# Patient Record
Sex: Male | Born: 1965 | Race: White | Hispanic: No | Marital: Married | Smoking: Never smoker
Health system: Southern US, Community
[De-identification: ages and names within clinical notes are randomized; demographics above are authoritative.]

## PROBLEM LIST (undated history)

## (undated) DIAGNOSIS — G4730 Sleep apnea, unspecified: Secondary | ICD-10-CM

## (undated) DIAGNOSIS — K219 Gastro-esophageal reflux disease without esophagitis: Secondary | ICD-10-CM

## (undated) DIAGNOSIS — R1310 Dysphagia, unspecified: Secondary | ICD-10-CM

## (undated) DIAGNOSIS — I10 Essential (primary) hypertension: Secondary | ICD-10-CM

## (undated) HISTORY — PX: OTHER SURGICAL HISTORY: SHX169

## (undated) HISTORY — PX: APPENDECTOMY: SHX54

## (undated) HISTORY — PX: WRIST SURGERY: SHX841

---

## 1990-06-08 HISTORY — PX: NASAL SINUS SURGERY: SHX719

## 2007-06-09 DIAGNOSIS — R1310 Dysphagia, unspecified: Secondary | ICD-10-CM

## 2007-06-09 DIAGNOSIS — K219 Gastro-esophageal reflux disease without esophagitis: Secondary | ICD-10-CM

## 2007-06-09 HISTORY — DX: Dysphagia, unspecified: R13.10

## 2007-06-09 HISTORY — DX: Gastro-esophageal reflux disease without esophagitis: K21.9

## 2007-06-09 HISTORY — PX: CARDIAC CATHETERIZATION: SHX172

## 2008-01-11 ENCOUNTER — Observation Stay (HOSPITAL_COMMUNITY): Admission: EM | Admit: 2008-01-11 | Discharge: 2008-01-12 | Payer: Self-pay | Admitting: Emergency Medicine

## 2008-01-30 ENCOUNTER — Encounter: Admission: RE | Admit: 2008-01-30 | Discharge: 2008-01-30 | Payer: Self-pay | Admitting: Gastroenterology

## 2010-10-21 NOTE — H&P (Signed)
Larry Hutchinson, Larry Hutchinson             ACCOUNT NO.:  [id number]   MEDICAL RECORD NO.:  [id number]          PATIENT TYPE:  INP   LOCATION:  4715                         FACILITY:  [facility]   PHYSICIAN:  Jake Bathe, MD      DATE OF BIRTH:  April 04, 1966   DATE OF ADMISSION:  01/11/2008  DATE OF DISCHARGE:                              HISTORY & PHYSICAL   PRIMARY CARE PHYSICIAN:  Deirdre Peer. Nehemiah Settle, MD, [facility].   CHIEF COMPLAINT:  Chest pain.   HISTORY OF PRESENT ILLNESS:  A 45 year old male with obesity,  hypertension, obstructive sleep apnea with increasing substernal chest  pain radiating to his jaw, lower teeth, usually lasting 2-3 minutes in  duration and relieved with rest.  Was 5 to 7/10 in intensity, but has  had milder episodes over the past 2 weeks.  New onset since 2 weeks ago.  He saw Dr. Nehemiah Settle who prescribed him Protonix over the past 10 days and  he has not had any significant relief.  He also experiences some  diaphoresis, flushing, and shortness of breath in conjunction with his  chest discomfort.  Currently, he is resting comfortably in no acute  distress.  Wife at bedside.   PAST MEDICAL HISTORY:  Hypertension, obstructive sleep apnea on CPAP,  hyperobesity, recent tobacco use, quit in February 2008 after 25 years  of smoking, polycythemia presumably reactive secondary to history of  smoking and erectile dysfunction.   PAST SURGICAL HISTORY:  Appendectomy in 1974, right arm laceration  severing tendons and arteries in 1984, and left jaw fracture in 1994.   ALLERGIES:  No known drug allergies.   MEDICATIONS:  1. Avalide 300/25 mg a day.  2. Levitra p.r.n.  3. Aspirin 81 mg a day.  4. Protonix 20 mg a day.   SOCIAL HISTORY:  Used to smoke 2 packs a day for greater than 25 years,  but quit last February.  Rarely drinks alcohol.  He is married.  Sells  furniture.   FAMILY HISTORY:  There is no early family history of coronary artery  disease.  His  father did have hypertension.   REVIEW OF SYSTEMS:  Occasionally short of breath.  No nausea.  No  vomiting.  No diarrhea.  No fevers.  No orthopnea known.  Unless  specified above, all other 12 review of systems negative.   PHYSICAL EXAM:  VITAL SIGNS:  Blood pressure 120/68 on arrival was  157/97, heart rate 74 to 87, respirations 20, and sating 100% on 2 L.  GENERAL:  Alert and oriented x3 in no acute distress, pleasant, mildly  concerned, lying in bed.  EYES:  Well-perfused conjunctivae.  EOMI.  No scleral icterus.  NECK:  Thick.  No carotid bruits.  No JVD.  CARDIOVASCULAR:  Regular rate and rhythm with no murmurs, rubs, or  gallops.  Distant heart sounds secondary to body habitus.  Unable to  palpate PMI.  LUNGS:  Clear to auscultation bilaterally.  Normal respiratory effort.  ABDOMEN:  Obese.  Positive bowel sounds.  Did not palpate spleen.  Nontender.  EXTREMITIES:  No clubbing, cyanosis,  or edema, 2+ femoral pulses  bilaterally with no bruits.  SKIN:  Warm, dry, and intact.  No rashes.  NEUROLOGIC:  Nonfocal.  Did not assess gait.  No tremors noted.   DATA:  EKG performed down in the emergency department shows sinus rhythm  with poor R-wave progression, isolated small Q-wave in lead III.  Cannot  rule out old anterior infarction.  None for prior comparison.  He did  have a stress test approximately 5 years ago which was read as normal  with no ischemia.   LABS:  Sodium 138, potassium 4.1, BUN 19, creatinine 1.3, and glucose  107.  PT 12.9, INR 1.0, and PTT 28.  White count 11.6, hemoglobin 15,  hematocrit 45, and platelets 320,000.  Chest x-ray, no abnormalities,  personally reviewed.   ASSESSMENT AND PLAN:  A 45 year old male with chest pain concerning for  possible unstable angina, hypertension, obesity, obstructive sleep  apnea, reactive polycythemia, and erectile dysfunction.  1. Unstable angina - so far first set of cardiac biomarkers are      reassuring; however,  his story with his progressive jaw      pain/radiation of chest pain is quite concerning.  After discussing      possible options, I have decided to take into the cardiac      catheterization lab for further diagnosis and possible treatment.      He and his wife understand risks and benefits of the procedure      including stroke, heart attack, death, bleeding, renal impairment,      damage to artery or vein, and the possible need for transfusion.      We will continue beta-blocker, aspirin, anticoagulation as needed,      and will likely place on Plavix post catheterization.  I will also      check a fasting lipid profile in the morning.  2. Hypertension - continue with current drug regimen in addition to      beta-blocker.  3. Obesity - encourage weight loss.  4. Tobacco abuse - congratulate him on tobacco cessation over the past      year.  5. Obstructive sleep apnea - continue CPAP.      Jake Bathe, MD  Electronically Signed     MCS/MEDQ  D:  01/11/2008  T:  01/12/2008  Job:  [id number]   cc:   Deirdre Peer. Polite, M.D.

## 2010-10-21 NOTE — Discharge Summary (Signed)
NAMEMATTHER, LABELL             ACCOUNT NO.:  [id number]   MEDICAL RECORD NO.:  [id number]          PATIENT TYPE:  INP   LOCATION:  4715                         FACILITY:  [facility]   PHYSICIAN:  Jake Bathe, MD      DATE OF BIRTH:  [birth year]   DATE OF ADMISSION:  01/11/2008  DATE OF DISCHARGE:  01/12/2008                               DISCHARGE SUMMARY   PRIMARY CARE PHYSICIAN:  Deirdre Peer. Polite, MD   FINAL DIAGNOSES:  1. Chest pain.  2. Obesity.  3. Hypertension.  4. Obstructive sleep apnea - on continuous positive airway pressure.  5. Low HDL with hypertriglyceridemia - new, start Niaspan.  6. Polycythemia.  7. Erectile dysfunction.   PROCEDURES:  Cardiac catheterization - 30% proximal to mid right  coronary artery plaquing, 40% second diagonal plaquing.  The remainder  of arteries have no angiographically significant coronary artery  disease.  There was no wall motion abnormality.  Normal ejection  fraction.  There was no evidence of abdominal aortic aneurysm or renal  artery stenosis.   BRIEF HOSPITAL COURSE:  See H&P for full details, but Mr. Platten is a 45-  year-old male who over the past 2 weeks has been having progressive  worsening of chest pain described as substernal, radiating to his neck  and jaw, causing an aching sensation in his lower teeth.  This has  occasionally occurred with exertion, but has also occurred with rest and  is not necessarily associated with food.  He has noted occasional  dysphasia where when eating a large piece of stake he feels as food  getting stuck in his lower esophagus.  He denies any recent weight loss.  He quit smoking about a year ago after 25 years.   After seeing him in the ER, after this most recent bout of chest  discomfort, radiating to his jaw with diaphoresis and shortness of  breath, I decided to take him to the Cardiac Catheterization Lab for  further evaluation.  See above.  Overnight, he did well.  No longer  complaining of chest pain, ambulating well.  Leg is doing well.  No  problems distally.   PHYSICAL EXAMINATION:  VITAL SIGNS:  Blood pressure 120/76; pulse 80-77;  temperature afebrile, 97.9;  respirations 16; and satting 96% on room  air.  GENERAL:  Alert and oriented x3, in no acute distress.  CARDIOVASCULAR:  Regular rate and rhythm with no murmurs, rubs, or  gallops.  LUNGS:  Clear to auscultation bilaterally.  EXTREMITIES:  Cath site clean, dry, and intact.  No evidence of  hematoma.  No bruits.  Normal distal pulses.  ABDOMEN:  Obese.  Positive bowel sounds.   LABORATORY DATA:  TSH normal at 1.4.  Cardiac enzymes negative x3.  Sodium 140, potassium 3.8, BUN 15, and creatinine 1.2.  Total  cholesterol 147, triglycerides 359, HDL 22, and LDL 53.  White count  9.5, hemoglobin 14, hematocrit 40, and platelet count 291.  Urine drug  screen was negative.  BNP was normal, less than 30.  PT/PTT was normal.  Chest x-ray was unremarkable.  Telemetry overnight  was unremarkable,  sinus rhythm.   MEDICATIONS:  1. Plavix 75 mg once a day for 30 days.  2. Aspirin 81 mg once a day.  3. Niaspan 500 mg once a day (take aspirin 30 minutes prior).  4. Avalide 300/25 mg once a day.  5. Fish oil 1000 mg b.i.d.   Given his low HDL of 22 and his triglycerides greater than 350, decided  to start him on Niaspan.  In 2 weeks, hopeful titration up to 1000.  Warned him of flushing.  I also asked him to take fish oil 1 g b.i.d.   DISCHARGE INSTRUCTIONS:  No heavy lifting for 3 days.  No driving for 3  days.  May return to work on Monday.   FOLLOWUP:  Followup scheduled on January 26, 2008, at 1:00 p.m.  I also  had placed a GI referral to work up gastrointestinal cause for chest  pain with associated dysphasia.  Possible etiologies include esophageal  spasm, possible stricture, and rule out mass.  The patient is aware that  if his pain becomes more intense or more worrisome to notify me  immediately  or to return to the emergency department.      Jake Bathe, MD  Electronically Signed     MCS/MEDQ  D:  01/12/2008  T:  01/12/2008  Job:  [id number]   cc:   Deirdre Peer. Polite, M.D.

## 2011-03-06 LAB — CARDIAC PANEL(CRET KIN+CKTOT+MB+TROPI)
CK, MB: 2.4
Relative Index: 2
Relative Index: 2.1
Total CK: 116
Troponin I: 0.01
Troponin I: 0.01

## 2011-03-06 LAB — RAPID URINE DRUG SCREEN, HOSP PERFORMED
Amphetamines: NOT DETECTED
Cocaine: NOT DETECTED
Tetrahydrocannabinol: NOT DETECTED

## 2011-03-06 LAB — BASIC METABOLIC PANEL
BUN: 15
Chloride: 108
GFR calc Af Amer: >60
GFR calc non Af Amer: >60
Potassium: 3.8
Sodium: 140

## 2011-03-06 LAB — LIPID PANEL
Cholesterol: 147
HDL: 22 — ABNORMAL LOW
LDL Cholesterol: 53
Triglycerides: 359 — ABNORMAL HIGH

## 2011-03-06 LAB — DIFFERENTIAL
Lymphocytes Relative: 23
Lymphs Abs: 2.7
Monocytes Absolute: 1.1 — ABNORMAL HIGH
Monocytes Relative: 10
Neutro Abs: 7.5

## 2011-03-06 LAB — CBC
Hemoglobin: 15.8
Platelets: 291
RBC: 5.43
RDW: 12.9
WBC: 11.6 — ABNORMAL HIGH
WBC: 9.5

## 2011-03-06 LAB — URINALYSIS, ROUTINE W REFLEX MICROSCOPIC
Bilirubin Urine: NEGATIVE
Hgb urine dipstick: NEGATIVE
Ketones, ur: NEGATIVE
Nitrite: NEGATIVE
Urobilinogen, UA: 1
pH: 7

## 2011-03-06 LAB — POCT I-STAT, CHEM 8
Chloride: 102
HCT: 46
Hemoglobin: 15.6
Potassium: 4.1

## 2011-03-06 LAB — PROTIME-INR: INR: 1

## 2011-03-06 LAB — PHENYTOIN LEVEL, TOTAL: Phenytoin Lvl: <2.5 — ABNORMAL LOW

## 2011-03-06 LAB — POCT CARDIAC MARKERS
Myoglobin, poc: 67.7
Myoglobin, poc: 88.5
Troponin i, poc: <0.05

## 2011-03-06 LAB — TSH: TSH: 1.451

## 2011-03-06 LAB — APTT: aPTT: 29

## 2014-02-07 ENCOUNTER — Telehealth: Payer: Self-pay

## 2014-02-07 ENCOUNTER — Encounter (HOSPITAL_COMMUNITY)
Admission: EM | Disposition: A | Discharge status: Home / Self Care | Payer: Self-pay | Source: Home / Self Care | Attending: Emergency Medicine

## 2014-02-07 ENCOUNTER — Encounter (HOSPITAL_COMMUNITY): Payer: Self-pay | Admitting: Emergency Medicine

## 2014-02-07 ENCOUNTER — Emergency Department (HOSPITAL_COMMUNITY): Payer: BC Managed Care – PPO | Admitting: Anesthesiology

## 2014-02-07 ENCOUNTER — Encounter (HOSPITAL_COMMUNITY): Payer: BC Managed Care – PPO | Admitting: Anesthesiology

## 2014-02-07 ENCOUNTER — Emergency Department (HOSPITAL_COMMUNITY)
Admission: EM | Admit: 2014-02-07 | Discharge: 2014-02-07 | Disposition: A | Discharge status: Home / Self Care | Payer: BC Managed Care – PPO | Attending: Emergency Medicine | Admitting: Emergency Medicine

## 2014-02-07 DIAGNOSIS — Z87891 Personal history of nicotine dependence: Secondary | ICD-10-CM | POA: Diagnosis not present

## 2014-02-07 DIAGNOSIS — T18108A Unspecified foreign body in esophagus causing other injury, initial encounter: Secondary | ICD-10-CM

## 2014-02-07 DIAGNOSIS — W44F3XA Food entering into or through a natural orifice, initial encounter: Secondary | ICD-10-CM

## 2014-02-07 DIAGNOSIS — I10 Essential (primary) hypertension: Secondary | ICD-10-CM | POA: Diagnosis not present

## 2014-02-07 DIAGNOSIS — IMO0002 Reserved for concepts with insufficient information to code with codable children: Secondary | ICD-10-CM | POA: Insufficient documentation

## 2014-02-07 DIAGNOSIS — K219 Gastro-esophageal reflux disease without esophagitis: Secondary | ICD-10-CM | POA: Insufficient documentation

## 2014-02-07 DIAGNOSIS — T18128A Food in esophagus causing other injury, initial encounter: Secondary | ICD-10-CM

## 2014-02-07 DIAGNOSIS — K222 Esophageal obstruction: Secondary | ICD-10-CM

## 2014-02-07 HISTORY — PX: ESOPHAGOGASTRODUODENOSCOPY: SHX5428

## 2014-02-07 HISTORY — DX: Gastro-esophageal reflux disease without esophagitis: K21.9

## 2014-02-07 HISTORY — PX: FOREIGN BODY REMOVAL: SHX962

## 2014-02-07 HISTORY — DX: Dysphagia, unspecified: R13.10

## 2014-02-07 HISTORY — DX: Essential (primary) hypertension: I10

## 2014-02-07 HISTORY — DX: Sleep apnea, unspecified: G47.30

## 2014-02-07 SURGERY — EGD (ESOPHAGOGASTRODUODENOSCOPY)
Anesthesia: Monitor Anesthesia Care

## 2014-02-07 MED ORDER — GLUCAGON HCL RDNA (DIAGNOSTIC) 1 MG IJ SOLR
1.0000 mg | Freq: Once | INTRAMUSCULAR | Status: AC
  Administered 2014-02-07: 1 mg via INTRAVENOUS
  Filled 2014-02-07: qty 1

## 2014-02-07 MED ORDER — SODIUM CHLORIDE 0.9 % IV SOLN: INTRAVENOUS | Status: DC

## 2014-02-07 MED ORDER — MIDAZOLAM HCL 5 MG/5ML IJ SOLN
INTRAMUSCULAR | Status: DC | PRN
  Administered 2014-02-07: .5 mg via INTRAVENOUS

## 2014-02-07 MED ORDER — SUCCINYLCHOLINE CHLORIDE 20 MG/ML IJ SOLN
INTRAMUSCULAR | Status: DC | PRN
  Administered 2014-02-07: 140 mg via INTRAVENOUS

## 2014-02-07 MED ORDER — LACTATED RINGERS IV SOLN
INTRAVENOUS | Status: DC
  Administered 2014-02-07: 11:00:00 via INTRAVENOUS

## 2014-02-07 MED ORDER — LIDOCAINE HCL (CARDIAC) 20 MG/ML IV SOLN
INTRAVENOUS | Status: DC | PRN
  Administered 2014-02-07: 60 mg via INTRAVENOUS

## 2014-02-07 MED ORDER — LACTATED RINGERS IV SOLN
INTRAVENOUS | Status: DC | PRN
  Administered 2014-02-07: 12:00:00 via INTRAVENOUS

## 2014-02-07 MED ORDER — PROPOFOL 10 MG/ML IV BOLUS
INTRAVENOUS | Status: DC | PRN
  Administered 2014-02-07: 175 mg via INTRAVENOUS

## 2014-02-07 MED ORDER — FENTANYL CITRATE 0.05 MG/ML IJ SOLN
INTRAMUSCULAR | Status: DC | PRN
  Administered 2014-02-07: 50 ug via INTRAVENOUS

## 2014-02-07 NOTE — ED Notes (Signed)
Patient states he feels better than he did when he arrived but still feels like the food is still lodge.

## 2014-02-07 NOTE — Telephone Encounter (Signed)
Message copied by Fuller Keen on Wed Feb 07, 2014  2:56 PM ------      Message from: Erskine Emery D      Created: Wed Feb 07, 2014 12:55 PM       Please schedule this patient for an EGD with balloon dilation in the [facility] in approximately 2-3 weeks ------

## 2014-02-07 NOTE — Discharge Instructions (Signed)
Esophageal Stricture The esophagus is the long, narrow tube which carries food and liquid from the mouth to the stomach. Sometimes a part of the esophagus becomes narrow and makes it difficult, painful, or even impossible to swallow. This is called an esophageal stricture.  CAUSES  Common causes of blockage or strictures of the esophagus are:  Exposure of the lower esophagus to the acid from the stomach may cause narrowing.  Hiatal hernia in which a small part of the stomach bulges up through the diaphragm can cause a narrowing in the bottom of the esophagus.  Scleroderma is a tissue disorder that affects the esophagus and makes swallowing difficult.  Achalasia is an absence of nerves in the lower esophagus and to the esophageal sphincter. This absence of nerves may be congenital (present since birth). This can cause irregular spasms which do not allow food and fluid through.  Strictures may develop from swallowing materials which damage the esophagus. Examples are acids or alkalis such as lye.  Schatzki's Ring is a narrow ring of non-cancerous tissue which narrows the lower esophagus. The cause of this is unknown.  Growths can block the esophagus. SYMPTOMS  Some of the problems are difficulty swallowing or pain with swallowing. DIAGNOSIS  Your caregiver often suspects this problem by taking a medical history. They will also do a physical exam. They may then take X-rays and/or perform an endoscopy. Endoscopy is an exam in which a tube like a small flexible telescope is used to look at your esophagus.  TREATMENT  One form of treatment is to dilate the narrow area. This means to stretch it.  When this is not successful, chest surgery may be required. This is a much more extensive form of treatment with a longer recovery time. Both of the above treatments make the passage of food and water into the stomach easier. They also make it easier for stomach contents to bubble back into the  esophagus. Special medications may be used following the procedure to help prevent further narrowing. Medications may be used to lower the amount of acid in the stomach juice.  SEEK IMMEDIATE MEDICAL CARE IF:   Your swallowing is becoming more painful, difficult, or you are unable to swallow.  You vomit up blood.  You develop black tarry stools.  You develop chills.  You have a fever.  You develop chest or abdominal pain.  You develop shortness of breath, feel lightheaded, or faint. Follow up with medical care as your caregiver suggests. Document Released: 02/02/2006 Document Revised: 08/17/2011 Document Reviewed: 10/04/2013 [facility] Patient Information 2015 [place], [place]. This information is not intended to replace advice given to you by your health care provider. Make sure you discuss any questions you have with your health care provider. Esophagogastroduodenoscopy Care After Refer to this sheet in the next few weeks. These instructions provide you with information on caring for yourself after your procedure. Your caregiver may also give you more specific instructions. Your treatment has been planned according to current medical practices, but problems sometimes occur. Call your caregiver if you have any problems or questions after your procedure.  HOME CARE INSTRUCTIONS  Do not eat or drink anything until the numbing medicine (local anesthetic) has worn off and your gag reflex has returned. You will know that the local anesthetic has worn off when you can swallow comfortably.  Do not drive for 12 hours after the procedure or as directed by your caregiver.  Only take medicines as directed by your caregiver. [place]  CARE IF:   You cannot stop coughing.  You are not urinating at all or less than usual. SEEK IMMEDIATE MEDICAL CARE IF:  You have difficulty swallowing.  You cannot eat or drink.  You have worsening throat or chest pain.  You have dizziness,  lightheadedness, or you faint.  You have nausea or vomiting.  You have chills.  You have a fever.  You have severe abdominal pain.  You have black, tarry, or bloody stools. Document Released: 05/11/2012 Document Reviewed: 05/11/2012 [facility] Patient Information 2015 [place]. This information is not intended to replace advice given to you by your health care provider. Make sure you discuss any questions you have with your health care provider.

## 2014-02-07 NOTE — ED Notes (Signed)
Post administration of medication patient began to vomit. Food still lodged at this time.

## 2014-02-07 NOTE — Anesthesia Preprocedure Evaluation (Addendum)
Anesthesia Evaluation  Patient identified by MRN, date of birth, ID band Patient awake    Reviewed: Allergy & Precautions, H&P , NPO status , Patient's Chart, lab work & pertinent test results  History of Anesthesia Complications (+) AWARENESS UNDER ANESTHESIA  Airway       Dental   Pulmonary former smoker,          Cardiovascular hypertension, Pt. on medications     Neuro/Psych    GI/Hepatic GERD-  ,  Endo/Other    Renal/GU      Musculoskeletal   Abdominal   Peds  Hematology   Anesthesia Other Findings   Reproductive/Obstetrics                         Anesthesia Physical Anesthesia Plan  ASA: III and emergent  Anesthesia Plan: MAC and General   Post-op Pain Management:    Induction:   Airway Management Planned: Oral ETT  Additional Equipment:   Intra-op Plan:   Post-operative Plan:   Informed Consent: I have reviewed the patients History and Physical, chart, labs and discussed the procedure including the risks, benefits and alternatives for the proposed anesthesia with the patient or authorized representative who has indicated his/her understanding and acceptance.     Plan Discussed with:   Anesthesia Plan Comments:        Anesthesia Quick Evaluation

## 2014-02-07 NOTE — Anesthesia Postprocedure Evaluation (Signed)
  Anesthesia Post-op Note  Patient: Larry Hutchinson  Procedure(s) Performed: Procedure(s): ESOPHAGOGASTRODUODENOSCOPY (EGD) (N/A) FOREIGN BODY REMOVAL (N/A)  Patient Location: PACU  Anesthesia Type:General  Level of Consciousness: alert   Airway and Oxygen Therapy: Patient Spontanous Breathing  Post-op Pain: none  Post-op Assessment: Post-op Vital signs reviewed, Patient's Cardiovascular Status Stable and Respiratory Function Stable  Post-op Vital Signs: Reviewed and stable  Last Vitals:  Filed Vitals:   02/07/14 1254  BP: 180/121  Pulse: 94  Temp: 36.6 C  Resp: 13    Complications: No apparent anesthesia complications

## 2014-02-07 NOTE — Consult Note (Signed)
[facility] Consult: 9:11 AM 02/07/2014  LOS: 0 days    Referring Provider: Dr Colin Rhein in ED  Primary Care Physician:  Urgent Care Primary Gastroenterologist:  Dr. Penelope Coop.  Will not see pt cause > 5 yrs last seen.     Reason for Consultation:  Food impaction.    HPI: Larry Hutchinson is a 48 y.o. male.  Hx htn and GERD.    Esophagram in 2009 showed 1. Small hiatal hernia with Schatzki's ring that did allow passage  of a 12.5 mm barium tablet, though the tablet did pause at the EG  junction for approximately 1 minute prior to entering the stomach.  Of note, the patient did experience symptoms with the tablet in the  distal esophagus.  2. Gastroesophageal reflux not elicited with use of the water  siphon maneuver. No radiographic evidence of esophagitis.  3. Small left lateral pharyngocele. No significant abnormalities  in the pharynx.  Has acute dysphagia events about 3 to 4 times per year but he is able to get them to pass with swallowing liquids or vomting.  Takes Omeprazole 20 mg daily.  About 1730 last night, a piece of stew meat got stuck and he was not able to get it to move.  Came to ED at 2 AM.  Despite Glucagon, meat still stuck.   Past Medical History  Diagnosis Date  . Hypertension   . Dysphagia 2009  . GERD (gastroesophageal reflux disease) 2009    Past Surgical History  Procedure Laterality Date  . Appendectomy  age 69  . Wrist surgery Right     repair of trauma involving flexor tendons.   . Cardiac catheterization  2009    Prior to Admission medications   Medication Sig Start Date End Date Taking? Authorizing Provider  aspirin EC 81 MG tablet Take 81 mg by mouth daily.   Yes Historical Provider, MD  lisinopril-hydrochlorothiazide (PRINZIDE,ZESTORETIC) 20-25 MG per tablet Take 1  tablet by mouth daily.   Yes Historical Provider, MD  omega-3 acid ethyl esters (LOVAZA) 1 G capsule Take 1 g by mouth daily.   Yes Historical Provider, MD  omeprazole (PRILOSEC) 20 MG capsule Take 20 mg by mouth daily.   Yes Historical Provider, MD    Scheduled Meds:  Infusions:  PRN Meds:      Allergies as of 02/07/2014  . (No Known Allergies)    History reviewed. No pertinent family history.  History   Social History  . Marital Status: Married    Spouse Name: N/A    Number of Children: N/A  . Years of Education: N/A   Occupational History  . tool and dye fabricator    Social History Main Topics  . Smoking status: Former Research scientist (life sciences)  . Smokeless tobacco: Not on file  . Alcohol Use: Yes     Comment: rare glass of wine a few times per month.   . Drug Use: No  . Sexual Activity: Not on file   Other Topics Concern  . Not on file   Social History Narrative  .  No narrative on file    REVIEW OF SYSTEMS: Constitutional:  Stable weight.  No fatigue ENT:  No nose bleeds Pulm:  No cough or DOE CV:  No palpitations, no LE edema. No chest pain. GU:  No hematuria, no frequency GI:  Per HPI Heme:  No anemia   Transfusions:  none Neuro:  No headaches, no peripheral tingling or numbness Derm:  No itching, no rash or sores.  Endocrine:  No sweats or chills.  No polyuria or dysuria Immunization:  Not queried.  Travel:  None beyond local counties in last few months.    PHYSICAL EXAM: Vital signs in last 24 hours: Filed Vitals:   02/07/14 0740  BP: 168/102  Pulse: 88  Temp:   Resp: 22   Wt Readings from Last 3 Encounters:  02/07/14 117.028 kg (258 lb)  02/07/14 117.028 kg (258 lb)    General: pleasant, somewhat uncomfortable but well appearing WM Head:  No swelling or assymetry  Eyes:  No icterus or pallor Ears:  Not HOH  Nose:  No congestion or discharge Mouth:  Clear and moist Neck:  No mass, no tenderness Lungs:  Clear bil.  No labored breathing Heart:  RRR.  No MRG Abdomen:  Soft, BS hypoactive, no mass or HSM.   Rectal: deferred   Musc/Skeltl: no joint swelling or contracture Extremities:  No CCE  Neurologic:  Oriented x 3.  No limb weakness Skin:  No rash or sores Nodes:  No cervical adenopathy   Psych:  Pleasant, relaxed.  Appropriate.   Intake/Output from previous day:   Intake/Output this shift:    LAB RESULTS: No results found for this basename: WBC, HGB, HCT, PLT,  in the last 72 hours BMET Lab Results  Component Value Date   NA 140 01/12/2008   NA 138 01/11/2008   K 3.8 01/12/2008   K 4.1 01/11/2008   CL 108 01/12/2008   CL 102 01/11/2008   CO2 29 01/12/2008   GLUCOSE 117* 01/12/2008   GLUCOSE 107* 01/11/2008   BUN 15 01/12/2008   BUN 19 01/11/2008   CREATININE 1.20 01/12/2008   CREATININE 1.3 01/11/2008   CALCIUM 8.7 01/12/2008   LFT No results found for this basename: PROT, ALBUMIN, AST, ALT, ALKPHOS, BILITOT, BILIDIR, IBILI,  in the last 72 hours   RADIOLOGY STUDIES: No results found.  ENDOSCOPIC STUDIES: none  IMPRESSION:   *  Food impaction in pt with hx periodic dysphagia.  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     PLAN:     *  EGD with removal of FB today.  Pt agreeable.    Azucena Freed  02/07/2014, 9:11 AM Pager: [phone]  GI Attending Note   Chart was reviewed and patient was examined. X-rays and lab were reviewed.    I agree with management and plans.  Patient has a food impaction very likely due to  an esophageal stricture.  Sandy Salaam. Deatra Ina, M.D., [facility] Cell [phone]

## 2014-02-07 NOTE — ED Notes (Signed)
EDP made aware of patients condition is the same as when he arrived.

## 2014-02-07 NOTE — ED Notes (Signed)
MD at bedside. 

## 2014-02-07 NOTE — ED Notes (Signed)
Patient states he take omeprazole for his spasms of his throat. He feels like he has food stuck at the top of his stomach. He cant hold down any fluids. Airway patent at this time. No respiratory distress.

## 2014-02-07 NOTE — Transfer of Care (Signed)
Immediate Anesthesia Transfer of Care Note  Patient: Larry Hutchinson  Procedure(s) Performed: Procedure(s): ESOPHAGOGASTRODUODENOSCOPY (EGD) (N/A) FOREIGN BODY REMOVAL (N/A)  Patient Location: PACU and Endoscopy Unit  Anesthesia Type:General  Level of Consciousness: awake and alert   Airway & Oxygen Therapy: Patient Spontanous Breathing and Patient connected to nasal cannula oxygen  Post-op Assessment: Report given to PACU RN, Post -op Vital signs reviewed and stable and Patient moving all extremities  Post vital signs: Reviewed and stable  Complications: No apparent anesthesia complications

## 2014-02-07 NOTE — ED Provider Notes (Signed)
CSN: [id number]     Arrival date & time 02/07/14  0245 History   First MD Initiated Contact with Patient 02/07/14 0254     Chief Complaint  Patient presents with  . Gastrophageal Reflux  . Choking     (Consider location/radiation/quality/duration/timing/severity/associated sxs/prior Treatment) HPI Patient has history of esophageal rings and is followed by [facility] gastroenterology. He states he's had multiple episodes of temporary esophageal obstruction from food which resolved after 1-2 hours. Yesterday evening at 5:30 PM patient ate a small piece of beef. He states since that time he has been able to unable to swallow any liquids or solids without immediate vomiting. He's been unable to tolerate his secretions. Patient denies any airway obstruction or difficulty breathing. Past Medical History  Diagnosis Date  . Hypertension    Past Surgical History  Procedure Laterality Date  . Appendectomy    . Fracture surgery    . Cardiac catheterization     History reviewed. No pertinent family history. History  Substance Use Topics  . Smoking status: Former Research scientist (life sciences)  . Smokeless tobacco: Not on file  . Alcohol Use: Yes    Review of Systems  Constitutional: Negative for fever and chills.  HENT: Positive for trouble swallowing. Negative for sore throat.   Respiratory: Negative for chest tightness, shortness of breath, wheezing and stridor.   Gastrointestinal: Positive for vomiting. Negative for nausea, abdominal pain and diarrhea.  Musculoskeletal: Negative for neck pain.  Skin: Negative for rash and wound.  Neurological: Negative for dizziness, weakness, light-headedness and numbness.  All other systems reviewed and are negative.     Allergies  Review of patient's allergies indicates no known allergies.  Home Medications   Prior to Admission medications   Medication Sig Start Date End Date Taking? Authorizing Provider  aspirin EC 81 MG tablet Take 81 mg by mouth daily.   Yes  Historical Provider, MD  lisinopril-hydrochlorothiazide (PRINZIDE,ZESTORETIC) 20-25 MG per tablet Take 1 tablet by mouth daily.   Yes Historical Provider, MD  omega-3 acid ethyl esters (LOVAZA) 1 G capsule Take 1 g by mouth daily.   Yes Historical Provider, MD  omeprazole (PRILOSEC) 20 MG capsule Take 20 mg by mouth daily.   Yes Historical Provider, MD   BP 152/107  Pulse 88  Temp(Src) 98.3 F (36.8 C) (Oral)  Resp 16  Ht 5\' 8"  (1.727 m)  Wt 258 lb (117.028 kg)  BMI 39.24 kg/m2  SpO2 98% Physical Exam  Nursing note and vitals reviewed. Constitutional: He is oriented to person, place, and time. He appears well-developed and well-nourished. No distress.  Spitting up clear saliva  HENT:  Head: Normocephalic and atraumatic.  Mouth/Throat: Oropharynx is clear and moist. No oropharyngeal exudate.  Eyes: EOM are normal. Pupils are equal, round, and reactive to light.  Neck: Normal range of motion. Neck supple.  Cardiovascular: Normal rate and regular rhythm.  Exam reveals no gallop and no friction rub.   No murmur heard. Pulmonary/Chest: Effort normal and breath sounds normal. No stridor. No respiratory distress. He has no wheezes. He has no rales.  Abdominal: Soft. Bowel sounds are normal. He exhibits no distension and no mass. There is no tenderness. There is no rebound and no guarding.  Musculoskeletal: Normal range of motion. He exhibits no edema and no tenderness.  Neurological: He is alert and oriented to person, place, and time.  Skin: Skin is warm and dry. No rash noted. No erythema.  Psychiatric: He has a normal mood and affect. His behavior  is normal.    ED Course  Procedures (including critical care time) Labs Review Labs Reviewed - No data to display  Imaging Review No results found.   EKG Interpretation None      MDM   Final diagnoses:  None    We'll attempt glucagon/soda and reevaluate. Patient still is unable to tolerate PO's will discuss with  gastroenterology on call.   Giving glucagon with persistence of symptoms. We'll discuss with gastroenterology.  Discussed with Dr. Henrene Pastor. We'll see patient in the emergency department  Julianne Rice, MD 02/07/14 [phone]

## 2014-02-07 NOTE — Anesthesia Procedure Notes (Signed)
Procedure Name: Intubation Date/Time: 02/07/2014 12:44 PM Performed by: [name] Pre-anesthesia Checklist: Patient identified Patient Re-evaluated:Patient Re-evaluated prior to inductionOxygen Delivery Method: Circle system utilized Preoxygenation: Pre-oxygenation with 100% oxygen Intubation Type: IV induction Ventilation: Mask ventilation without difficulty Laryngoscope Size: Miller and 3 Grade View: Grade I Tube type: Oral Tube size: 7.5 mm Number of attempts: 1 Airway Equipment and Method: Stylet

## 2014-02-07 NOTE — Telephone Encounter (Signed)
Message copied by Keimon Keen on Wed Feb 07, 2014  1:53 PM ------      Message from: Erskine Emery D      Created: Wed Feb 07, 2014 12:55 PM       Please schedule this patient for an EGD with balloon dilation in the [facility] in approximately 2-3 weeks ------

## 2014-02-07 NOTE — Telephone Encounter (Signed)
Left message for the patient to call back. Appointments are scheduled.

## 2014-02-07 NOTE — Op Note (Signed)
[facility] [place], [address]   ENDOSCOPY PROCEDURE REPORT  PATIENT: Larry Hutchinson, Larry Hutchinson  MR#: [id number] BIRTHDATE: May 11, 1966 , 48  yrs. old GENDER: Male ENDOSCOPIST: Inda Castle, MD REFERRED BY: PROCEDURE DATE:  02/07/2014 PROCEDURE:  EGD w/ fb removal ASA CLASS:     Class II INDICATIONS:  Foreign body removal from esophagus. MEDICATIONS: See Anesthesia Report. TOPICAL ANESTHETIC:  DESCRIPTION OF PROCEDURE: After the risks benefits and alternatives of the procedure were thoroughly explained, informed consent was obtained.  The Pentax Gastroscope [id number] endoscope was introduced through the mouth and advanced to the third portion of the duodenum. Without limitations.  The instrument was slowly withdrawn as the mucosa was fully examined.      There was a moderate amount of solid food in the distal esophagus. This was gently pushed through a distal esophageal sphincter and into the stomach.  At the GE junction there was a moderate stricture.  The 9 mm gastroscope passed with mild resistance. There was moderate surrounding erythema in the area of the stricture.   The remainder of the upper endoscopy exam was otherwise normal.  Retroflexed views revealed no abnormalities. The scope was then withdrawn from the patient and the procedure completed.  COMPLICATIONS: There were no complications. ENDOSCOPIC IMPRESSION: 1.   esophageal food impaction 2. distal esophageal stricture  RECOMMENDATIONS: 1.  continue PPI therapy 2.  EGD with esophageal dilation in 2-3 weeks REPEAT EXAM:  eSigned:  Inda Castle, MD 02/07/2014 12:54 PM   CC:

## 2014-02-07 NOTE — ED Notes (Signed)
Azucena Freed, PA was at the bedside speaking with the pt.

## 2014-02-08 ENCOUNTER — Encounter (HOSPITAL_COMMUNITY): Payer: Self-pay | Admitting: Gastroenterology

## 2014-02-08 NOTE — Telephone Encounter (Signed)
I have left message for the patient to call back 

## 2014-02-09 ENCOUNTER — Other Ambulatory Visit: Payer: Self-pay

## 2014-02-09 DIAGNOSIS — K222 Esophageal obstruction: Secondary | ICD-10-CM

## 2014-02-09 NOTE — Telephone Encounter (Signed)
Patient advised and agrees to the appointments.

## 2014-02-19 ENCOUNTER — Ambulatory Visit (AMBULATORY_SURGERY_CENTER): Payer: Self-pay

## 2014-02-19 VITALS — Ht 68.0 in | Wt 258.0 lb

## 2014-02-19 DIAGNOSIS — R1319 Other dysphagia: Secondary | ICD-10-CM

## 2014-02-19 NOTE — Progress Notes (Signed)
No allergies to eggs or soy No past problems with anesthesia No home oxygen (CPAP) No diet/weight loss meds  Has email  Emmi instructions given for egd

## 2014-02-22 ENCOUNTER — Ambulatory Visit (AMBULATORY_SURGERY_CENTER): Payer: BC Managed Care – PPO | Admitting: Gastroenterology

## 2014-02-22 ENCOUNTER — Encounter: Payer: Self-pay | Admitting: Gastroenterology

## 2014-02-22 VITALS — BP 128/79 | HR 72 | Temp 96.5°F | Resp 23 | Ht 68.0 in | Wt 258.0 lb

## 2014-02-22 DIAGNOSIS — K222 Esophageal obstruction: Secondary | ICD-10-CM

## 2014-02-22 DIAGNOSIS — R1319 Other dysphagia: Secondary | ICD-10-CM

## 2014-02-22 DIAGNOSIS — K2980 Duodenitis without bleeding: Secondary | ICD-10-CM

## 2014-02-22 MED ORDER — SODIUM CHLORIDE 0.9 % IV SOLN: 500.0000 mL | INTRAVENOUS | Status: DC

## 2014-02-22 NOTE — Op Note (Signed)
[facility]  [address] [place], [address]   ENDOSCOPY PROCEDURE REPORT  PATIENT: Larry Hutchinson, Larry Hutchinson  MR#: [id number] BIRTHDATE: 02/26/1966 , 48  yrs. old GENDER: Male ENDOSCOPIST: Inda Castle, MD REFERRED BY: PROCEDURE DATE:  02/22/2014 PROCEDURE:  EGD w/ biopsy and Maloney dilation of esophagus ASA CLASS:     Class II INDICATIONS:  Therapeutic procedure.   Dysphagia. MEDICATIONS: MAC sedation, administered by CRNA and propofol (Diprivan) 200mg  IV TOPICAL ANESTHETIC:  DESCRIPTION OF PROCEDURE: After the risks benefits and alternatives of the procedure were thoroughly explained, informed consent was obtained.  The LB [id number] endoscope was introduced through the mouth and advanced to the third portion of the duodenum. Without limitations.  The instrument was slowly withdrawn as the mucosa was fully examined.        ESOPHAGUS: A stricture was found at the gastroesophageal junction. The stenosis was traversable with the endoscope.  DUODENUM: A single polyp was found at the apex of the duodenal bulb. polyp was semi-pedunculated with normal overlying mucosa. the remainder the exam is normal. Multiple biopsies were taken. Retroflexed views revealed no abnormalities.     The scope was then withdrawn from the patient and the procedure completed.  #s 48 and 52 [place] Maloney dilators were passed with moderate resistance. There was no heme.  COMPLICATIONS: There were no complications. ENDOSCOPIC IMPRESSION: esophageal stricture-status post Venia Minks dilation Duodenal polyp  RECOMMENDATIONS: Repeat dilation as needed Await biopsy results Continue omeprazole REPEAT EXAM:  eSigned:  Inda Castle, MD 02/22/2014 8:56 AM   GG:YIRSWN, Jori Moll MD  PATIENT NAME:  [name], [name] MR#: [id number]

## 2014-02-22 NOTE — Patient Instructions (Addendum)
YOU HAD AN ENDOSCOPIC PROCEDURE TODAY AT [facility]: Refer to the procedure report that was given to you for any specific questions about what was found during the examination.  If the procedure report does not answer your questions, please call your gastroenterologist to clarify.  If you requested that your care partner not be given the details of your procedure findings, then the procedure report has been included in a sealed envelope for you to review at your convenience later.  YOU SHOULD EXPECT: Some feelings of bloating in the abdomen. Passage of more gas than usual.  Walking can help get rid of the air that was put into your GI tract during the procedure and reduce the bloating. If you had a lower endoscopy (such as a colonoscopy or flexible sigmoidoscopy) you may notice spotting of blood in your stool or on the toilet paper. If you underwent a bowel prep for your procedure, then you may not have a normal bowel movement for a few days.  DIET: Drink plenty of fluids but you should avoid alcoholic beverages for 24 hours.  Please follow the dilatation diet the rest of the day.  ACTIVITY: Your care partner should take you home directly after the procedure.  You should plan to take it easy, moving slowly for the rest of the day.  You can resume normal activity the day after the procedure however you should NOT DRIVE or use heavy machinery for 24 hours (because of the sedation medicines used during the test).    SYMPTOMS TO REPORT IMMEDIATELY: A gastroenterologist can be reached at any hour.  During normal business hours, 8:30 AM to 5:00 PM Monday through Friday, call [phone].  After hours and on weekends, please call the GI answering service at [phone] who will take a message and have the physician on call contact you.    Following upper endoscopy (EGD)  Vomiting of blood or coffee ground material  New chest pain or pain under the shoulder blades  Painful or  persistently difficult swallowing  New shortness of breath  Fever of 100F or higher  Black, tarry-looking stools  FOLLOW UP: If any biopsies were taken you will be contacted by phone or by letter within the next 1-3 weeks.  Call your gastroenterologist if you have not heard about the biopsies in 3 weeks.  Our staff will call the home number listed on your records the next business day following your procedure to check on you and address any questions or concerns that you may have at that time regarding the information given to you following your procedure. This is a courtesy call and so if there is no answer at the home number and we have not heard from you through the emergency physician on call, we will assume that you have returned to your regular daily activities without incident.     Handout was given to your care partner on a dilatation diet to follow the rest of the day. You may resume your current medications today.  Continue omeprazole per Dr. Deatra Ina. Await biopsy results. Please call if any questions or concerns.    SIGNATURES/CONFIDENTIALITY: You and/or your care partner have signed paperwork which will be entered into your electronic medical record.  These signatures attest to the fact that that the information above on your After Visit Summary has been reviewed and is understood.  Full responsibility of the confidentiality of this discharge information lies with you and/or your care-partner.

## 2014-02-22 NOTE — Progress Notes (Signed)
Called to room to assist during endoscopic procedure.  Patient ID and intended procedure confirmed with present staff. Received instructions for my participation in the procedure from the performing physician.  

## 2014-02-22 NOTE — Progress Notes (Signed)
No problems noted in the recovery room. maw 

## 2014-02-22 NOTE — Progress Notes (Signed)
Report to PACU, RN, vss, BBS= Clear.  

## 2014-02-23 ENCOUNTER — Telehealth: Payer: Self-pay | Admitting: *Deleted

## 2014-02-23 NOTE — Telephone Encounter (Signed)
  Follow up Call-  Call back number 02/22/2014  Post procedure Call Back phone  # [phone]  Permission to leave phone message Yes     Patient questions:  Do you have a fever, pain , or abdominal swelling? No. Pain Score  0 *  Have you tolerated food without any problems? Yes.    Have you been able to return to your normal activities? Yes.    Do you have any questions about your discharge instructions: Diet   No. Medications  No. Follow up visit  No.  Do you have questions or concerns about your Care? No.  Actions: * If pain score is 4 or above: No action needed, pain <4.

## 2014-02-28 ENCOUNTER — Encounter: Payer: Self-pay | Admitting: Gastroenterology

## 2014-03-23 ENCOUNTER — Telehealth: Payer: Self-pay | Admitting: Internal Medicine

## 2014-03-23 NOTE — Telephone Encounter (Signed)
Pt is not seeing [facility] and would like to est w/ [facility]. Would like to know if you will accept him as a pt?

## 2014-03-26 NOTE — Telephone Encounter (Signed)
My practice is closed.

## 2014-04-10 ENCOUNTER — Other Ambulatory Visit (INDEPENDENT_AMBULATORY_CARE_PROVIDER_SITE_OTHER): Payer: BC Managed Care – PPO

## 2014-04-10 ENCOUNTER — Ambulatory Visit (INDEPENDENT_AMBULATORY_CARE_PROVIDER_SITE_OTHER): Payer: BC Managed Care – PPO | Admitting: Family

## 2014-04-10 ENCOUNTER — Encounter: Payer: Self-pay | Admitting: Family

## 2014-04-10 VITALS — BP 146/90 | HR 62 | Temp 97.8°F | Resp 16 | Ht 68.0 in | Wt 263.0 lb

## 2014-04-10 DIAGNOSIS — R200 Anesthesia of skin: Secondary | ICD-10-CM

## 2014-04-10 DIAGNOSIS — R208 Other disturbances of skin sensation: Secondary | ICD-10-CM | POA: Diagnosis not present

## 2014-04-10 DIAGNOSIS — M25579 Pain in unspecified ankle and joints of unspecified foot: Secondary | ICD-10-CM | POA: Diagnosis not present

## 2014-04-10 DIAGNOSIS — G4730 Sleep apnea, unspecified: Secondary | ICD-10-CM

## 2014-04-10 LAB — HEMOGLOBIN A1C: HEMOGLOBIN A1C: 5.5 % (ref 4.6–6.5)

## 2014-04-10 MED ORDER — LISINOPRIL-HYDROCHLOROTHIAZIDE 20-25 MG PO TABS: 1.0000 | ORAL_TABLET | Freq: Every day | ORAL | Status: DC

## 2014-04-10 NOTE — Patient Instructions (Addendum)
Thank you for choosing [facility].  Summary/Instructions:   Try over the counter medication such as ibuprofen and aleve as needed for pain  Please stop by the lab before leaving for your blood work. We will be in touch regarding the results.  Try [employer] for shoes if you need them  Plan to follow up in about a month for your blood pressure.

## 2014-04-10 NOTE — Progress Notes (Signed)
   Subjective:    Patient ID: Larry Hutchinson, male    DOB: December 13, 1965, 48 y.o.   MRN: [id number]  Chief Complaint  Patient presents with  . Establish Care    Having bilateral foot pain x1 year   HPI:  Larry Hutchinson is a 48 y.o. male who presents today to establish care and discuss foot pain.   1) Foot pain - Not constant pain. Both feet feel like they are bruised throughout the entire foot. Usually worst when sitting for long periods of time. Sitting does bother it on occasion. Describes it as cyclical. Goes through periods of time where it gets worse. At worst, the pain is 8/10, currently rated around a 5-6/10. Denies current numbness and burning, but not experiencing it. Denies any trauma to either foot. No footwear make it better or worse. Has tried Aleve, but does not provide any relief that he can recall. After he has been up and walking the pain disappears.   No Known Allergies  Current Outpatient Prescriptions on File Prior to Visit  Medication Sig Dispense Refill  . aspirin EC 81 MG tablet Take 81 mg by mouth daily.    [name] omega-3 acid ethyl esters (LOVAZA) 1 G capsule Take 1 g by mouth daily.    [name] omeprazole (PRILOSEC) 20 MG capsule Take 20 mg by mouth daily.     No current facility-administered medications on file prior to visit.   Past Medical History  Diagnosis Date  . Hypertension   . Dysphagia 2009  . GERD (gastroesophageal reflux disease) 2009  . Sleep apnea     uses CPAP at night    Review of Systems   See HPI   Objective:    BP 146/90 mmHg  Pulse 62  Temp(Src) 97.8 F (36.6 C) (Oral)  Resp 16  Ht 5\' 8"  (1.727 m)  Wt 263 lb (119.296 kg)  BMI 40.00 kg/m2  SpO2 95% Nursing note and vital signs reviewed.  Physical Exam  Constitutional: He is oriented to person, place, and time. No distress.  Obese male seated in the chair, appears his stated age, and is dressed appropriately.  Cardiovascular: Normal rate, regular rhythm, normal heart sounds and  intact distal pulses.   Pulmonary/Chest: Effort normal and breath sounds normal.  Musculoskeletal:  No obvious deformity, discoloration or edema of bilateral feet noted. Unable to elicit tenderness bilaterally. Sensation and strength intact and appropriate. Pulses are 1+ posterior tibial and the dorsalis pedis.   Neurological: He is alert and oriented to person, place, and time.  Skin: Skin is warm and dry.  Psychiatric: He has a normal mood and affect. His behavior is normal. Judgment and thought content normal.       Assessment & Plan:

## 2014-04-10 NOTE — Assessment & Plan Note (Signed)
Symptoms consistent with plantar facsiciities, however exam does not confirm. Given occasional numbness and tingling will check B12 and HgA1c. Use OTC NSAIDs as needed for pain control. Ice massage as needed. Follow up if symptoms worsen or fail to improve.

## 2014-04-10 NOTE — Progress Notes (Signed)
Pre visit review using our clinic review tool, if applicable. No additional management support is needed unless otherwise documented below in the visit note. 

## 2014-04-12 ENCOUNTER — Telehealth: Payer: Self-pay | Admitting: Family

## 2014-04-12 LAB — METHYLMALONIC ACID, SERUM: Methylmalonic Acid, Quant: 139 nmol/L (ref 87–318)

## 2014-04-12 NOTE — Telephone Encounter (Signed)
Please call the patient and inform him that both his A1c and B12 were within the normal limits thus he does not have diabetes or a vitamin B12 deficiency.  Therefore these are not the reason for the numbness of his feet.

## 2014-04-13 NOTE — Telephone Encounter (Signed)
Let pt know all his lab result were within normal limits.

## 2014-04-16 ENCOUNTER — Other Ambulatory Visit: Payer: Self-pay

## 2014-04-16 DIAGNOSIS — G4730 Sleep apnea, unspecified: Secondary | ICD-10-CM

## 2014-05-18 ENCOUNTER — Encounter: Payer: Self-pay | Admitting: Family

## 2014-05-18 ENCOUNTER — Ambulatory Visit (INDEPENDENT_AMBULATORY_CARE_PROVIDER_SITE_OTHER): Payer: BC Managed Care – PPO | Admitting: Family

## 2014-05-18 ENCOUNTER — Other Ambulatory Visit (INDEPENDENT_AMBULATORY_CARE_PROVIDER_SITE_OTHER): Payer: BC Managed Care – PPO

## 2014-05-18 VITALS — BP 132/86 | HR 71 | Temp 97.9°F | Ht 68.0 in | Wt 266.0 lb

## 2014-05-18 DIAGNOSIS — Z0001 Encounter for general adult medical examination with abnormal findings: Secondary | ICD-10-CM | POA: Insufficient documentation

## 2014-05-18 DIAGNOSIS — R790 Abnormal level of blood mineral: Secondary | ICD-10-CM

## 2014-05-18 DIAGNOSIS — Z0000 Encounter for general adult medical examination without abnormal findings: Secondary | ICD-10-CM

## 2014-05-18 DIAGNOSIS — N529 Male erectile dysfunction, unspecified: Secondary | ICD-10-CM | POA: Insufficient documentation

## 2014-05-18 LAB — BASIC METABOLIC PANEL
BUN: 20 mg/dL (ref 6–23)
CO2: 27 mEq/L (ref 19–32)
CREATININE: 1.3 mg/dL (ref 0.4–1.5)
Calcium: 9.3 mg/dL (ref 8.4–10.5)
Chloride: 108 mEq/L (ref 96–112)
GFR: 62.18 mL/min (ref >60.00)
Glucose, Bld: 90 mg/dL (ref 70–99)
Potassium: 4 mEq/L (ref 3.5–5.1)
Sodium: 139 mEq/L (ref 135–145)

## 2014-05-18 LAB — LIPID PANEL
CHOL/HDL RATIO: 6
CHOLESTEROL: 168 mg/dL (ref 0–200)
HDL: 30 mg/dL — AB (ref >39.00)
NONHDL: 138
TRIGLYCERIDES: 201 mg/dL — AB (ref 0.0–149.0)
VLDL: 40.2 mg/dL — AB (ref 0.0–40.0)

## 2014-05-18 LAB — CBC
HCT: 46.3 % (ref 39.0–52.0)
HEMOGLOBIN: 15.4 g/dL (ref 13.0–17.0)
MCHC: 33.2 g/dL (ref 30.0–36.0)
MCV: 83.9 fl (ref 78.0–100.0)
Platelets: 347 10*3/uL (ref 150.0–400.0)
RBC: 5.51 Mil/uL (ref 4.22–5.81)
RDW: 13.4 % (ref 11.5–15.5)
WBC: 12.6 10*3/uL — ABNORMAL HIGH (ref 4.0–10.5)

## 2014-05-18 LAB — TSH: TSH: 1.45 u[IU]/mL (ref 0.35–4.50)

## 2014-05-18 LAB — LDL CHOLESTEROL, DIRECT: Direct LDL: 99.5 mg/dL

## 2014-05-18 MED ORDER — SILDENAFIL CITRATE 50 MG PO TABS: 50.0000 mg | ORAL_TABLET | Freq: Every day | ORAL | Status: DC | PRN

## 2014-05-18 NOTE — Progress Notes (Signed)
Subjective:    Patient ID: Larry Hutchinson, male    DOB: 1966/05/18, 48 y.o.   MRN: [id number]  Chief Complaint  Patient presents with  . Annual Exam    HPI:  Larry Hutchinson is a 48 y.o. male who presents today for an annual wellness visit.   1) Health Maintenance - Rates his overall as feeling well.  Diet - Avoids soft drinks and lemonade. Tries to eat fruits and vegetables - can do a better job. Has his own eggs. Exercise - Non-existent currently Sleep - 7-8 hours nightly.   2) Preventative Exams / Immunizations:  Dental --  Due for exam Vision -- Up to date   Health Maintenance  Topic Date Due  . TETANUS/TDAP  08/28/1983  . INFLUENZA VACCINE  01/06/2014   There is no immunization history on file for this patient.  No Known Allergies  Current Outpatient Prescriptions on File Prior to Visit  Medication Sig Dispense Refill  . aspirin EC 81 MG tablet Take 81 mg by mouth daily.    [name] lisinopril-hydrochlorothiazide (PRINZIDE,ZESTORETIC) 20-25 MG per tablet Take 1 tablet by mouth daily. 90 tablet 3  . omega-3 acid ethyl esters (LOVAZA) 1 G capsule Take 1 g by mouth daily.    [name] omeprazole (PRILOSEC) 20 MG capsule Take 20 mg by mouth daily.     No current facility-administered medications on file prior to visit.   Past Medical History  Diagnosis Date  . Hypertension   . Dysphagia 2009  . GERD (gastroesophageal reflux disease) 2009  . Sleep apnea     uses CPAP at night   Past Surgical History  Procedure Laterality Date  . Appendectomy  age 57  . Wrist surgery Right     repair of trauma involving flexor tendons.   . Cardiac catheterization  2009  . Esophagogastroduodenoscopy N/A 02/07/2014    Procedure: ESOPHAGOGASTRODUODENOSCOPY (EGD);  Surgeon: Inda Castle, MD;  Location: [facility];  Service: Endoscopy;  Laterality: N/A;  . Foreign body removal N/A 02/07/2014    Procedure: FOREIGN BODY REMOVAL;  Surgeon: Inda Castle, MD;  Location: [facility];  Service:  Endoscopy;  Laterality: N/A;  . Facial reconstructive surgery     Family History  Problem Relation Age of Onset  . Leukemia Maternal Grandmother   . Heart disease Paternal Grandmother   . Stroke Paternal Grandmother   . Heart disease Paternal Grandfather   . Lupus Paternal Grandfather   . Leukemia Paternal Grandfather    History   Social History  . Marital Status: Married    Spouse Name: N/A    Number of Children: 2  . Years of Education: 12   Occupational History  . tool and dye fabricator    Social History Main Topics  . Smoking status: Former Smoker -- 1.50 packs/day for 25 years    Types: Cigarettes, Cigars    Quit date: 04/11/2007  . Smokeless tobacco: Former Systems developer    Types: Snuff, Chew  . Alcohol Use: 0.0 oz/week    0 Not specified per week     Comment: rare glass of wine a few times per month.   . Drug Use: No  . Sexual Activity: Not on file   Other Topics Concern  . Not on file   Social History Narrative   Born and raised in [place], [place]. Currently resides in a private residence with his wife. 3 dogs.    Fun: Read (favorite fantasy), fish   Denies religious beliefs that would  effect healthcare.          Review of Systems  Constitutional: Denies fever, chills, fatigue, or significant weight gain/loss. HENT: Head: Denies headache or neck pain Ears: Denies changes in hearing, ringing in ears, earache, drainage Nose: Denies discharge, stuffiness, itching, nosebleed, sinus pain Throat: Denies sore throat, hoarseness, dry mouth, sores, thrush Eyes: Denies loss/changes in vision, pain, redness, blurry/double vision, flashing lights Cardiovascular: Denies chest pain/discomfort, tightness, palpitations, shortness of breath with activity, difficulty lying down, swelling, sudden awakening with shortness of breath Respiratory: Denies shortness of breath, cough, sputum production, wheezing Gastrointestinal: Denies dysphasia, heartburn, change in appetite, nausea,  change in bowel habits, rectal bleeding, constipation, diarrhea, yellow skin or eyes Genitourinary: Denies frequency, urgency, burning/pain, blood in urine, incontinence, change in urinary strength. Positive for erectile dysfunction Musculoskeletal: Denies muscle/joint pain, stiffness, back pain, redness or swelling of joints, trauma Skin: Denies rashes, lumps, itching, dryness, color changes, or hair/nail changes Neurological: Denies dizziness, fainting, seizures, weakness, numbness, tingling, tremor Psychiatric - Denies nervousness, stress, depression or memory loss Endocrine: Denies heat or cold intolerance, sweating, frequent urination, excessive thirst, changes in appetite Hematologic: Denies ease of bruising or bleeding    Objective:    BP 132/86 mmHg  Pulse 71  Temp(Src) 97.9 F (36.6 C) (Oral)  Ht 5\' 8"  (1.727 m)  Wt 266 lb (120.657 kg)  BMI 40.45 kg/m2  SpO2 96% Nursing note and vital signs reviewed.  Physical Exam  Constitutional: He is oriented to person, place, and time. He appears well-developed and well-nourished.  HENT:  Head: Normocephalic.  Right Ear: Hearing, tympanic membrane, external ear and ear canal normal.  Left Ear: Hearing, tympanic membrane, external ear and ear canal normal.  Nose: Nose normal.  Mouth/Throat: Uvula is midline, oropharynx is clear and moist and mucous membranes are normal.  Eyes: Conjunctivae and EOM are normal. Pupils are equal, round, and reactive to light.  Neck: Neck supple. No JVD present. No tracheal deviation present. No thyromegaly present.  Cardiovascular: Normal rate, regular rhythm, normal heart sounds and intact distal pulses.   Pulmonary/Chest: Effort normal and breath sounds normal.  Abdominal: Soft. Bowel sounds are normal. He exhibits no distension and no mass. There is no tenderness. There is no rebound and no guarding.  Musculoskeletal: Normal range of motion. He exhibits no edema or tenderness.  Lymphadenopathy:    He  has no cervical adenopathy.  Neurological: He is alert and oriented to person, place, and time. He has normal reflexes. No cranial nerve deficit. He exhibits normal muscle tone. Coordination normal.  Skin: Skin is warm and dry.  Psychiatric: He has a normal mood and affect. His behavior is normal. Judgment and thought content normal.      Assessment & Plan:

## 2014-05-18 NOTE — Assessment & Plan Note (Signed)
Start Viagra. Will obtain testosterone level next morning appointment.

## 2014-05-18 NOTE — Assessment & Plan Note (Signed)
1) Anticipatory Guidance: Discussed importance of wearing a seatbelt while driving and not texting while driving; changing batteries in smoke detector at least once annually; wearing suntan lotion when outside; eating a balanced and moderate diet; getting physical activity at least 30 minutes per day.  2) Immunizations / Screenings / Labs:  Due for tetanus shot all. Declined flu shot. All other immunizations are up-to-date. Due for a dental exam. All other screenings are up-to-date her recommendations. Obtain CBC, BMET, Lipid profile and TSH.    Overall well exam. Discussed risk factors of obesity with patient. Encouraged increasing in nutrient density on increasing physical activity. Recommend reading "eat to live". Recommend losing 5-10% of his body weight. Follow up prevention exam in one year pending lab results.

## 2014-05-18 NOTE — Patient Instructions (Addendum)
Thank you for choosing [facility].  Summary/Instructions:  Your prescription(s) have been submitted to your pharmacy. Please take as directed and contact our office if you believe you are having problem(s) with the medication(s).  Please stop by the lab on the basement level of the building for your blood work. Your results will be released to [place] (or called to you) after review, usually within 72hours after test completion. If any changes need to be made, you will be notified at that same time.  Health Maintenance A healthy lifestyle and preventative care can promote health and wellness.  Maintain regular health, dental, and eye exams.  Eat a healthy diet. Foods like vegetables, fruits, whole grains, low-fat dairy products, and lean protein foods contain the nutrients you need and are low in calories. Decrease your intake of foods high in solid fats, added sugars, and salt. Get information about a proper diet from your health care provider, if necessary.  Regular physical exercise is one of the most important things you can do for your health. Most adults should get at least 150 minutes of moderate-intensity exercise (any activity that increases your heart rate and causes you to sweat) each week. In addition, most adults need muscle-strengthening exercises on 2 or more days a week.   Maintain a healthy weight. The body mass index (BMI) is a screening tool to identify possible weight problems. It provides an estimate of body fat based on height and weight. Your health care provider can find your BMI and can help you achieve or maintain a healthy weight. For males 20 years and older:  A BMI below 18.5 is considered underweight.  A BMI of 18.5 to 24.9 is normal.  A BMI of 25 to 29.9 is considered overweight.  A BMI of 30 and above is considered obese.  Maintain normal blood lipids and cholesterol by exercising and minimizing your intake of saturated fat. Eat a balanced diet with  plenty of fruits and vegetables. Blood tests for lipids and cholesterol should begin at age 57 and be repeated every 5 years. If your lipid or cholesterol levels are high, you are over age 47, or you are at high risk for heart disease, you may need your cholesterol levels checked more frequently.Ongoing high lipid and cholesterol levels should be treated with medicines if diet and exercise are not working.  If you smoke, find out from your health care provider how to quit. If you do not use tobacco, do not start.  Lung cancer screening is recommended for adults aged 78-80 years who are at high risk for developing lung cancer because of a history of smoking. A yearly low-dose CT scan of the lungs is recommended for people who have at least a 30-pack-year history of smoking and are current smokers or have quit within the past 15 years. A pack year of smoking is smoking an average of 1 pack of cigarettes a day for 1 year (for example, a 30-pack-year history of smoking could mean smoking 1 pack a day for 30 years or 2 packs a day for 15 years). Yearly screening should continue until the smoker has stopped smoking for at least 15 years. Yearly screening should be stopped for people who develop a health problem that would prevent them from having lung cancer treatment.  If you choose to drink alcohol, do not have more than 2 drinks per day. One drink is considered to be 12 oz (360 mL) of beer, 5 oz (150 mL) of wine, or  1.5 oz (45 mL) of liquor.  Avoid the use of street drugs. Do not share needles with anyone. Ask for help if you need support or instructions about stopping the use of drugs.  High blood pressure causes heart disease and increases the risk of stroke. Blood pressure should be checked at least every 1-2 years. Ongoing high blood pressure should be treated with medicines if weight loss and exercise are not effective.  If you are 3-79 years old, ask your health care provider if you should take  aspirin to prevent heart disease.  Diabetes screening involves taking a blood sample to check your fasting blood sugar level. This should be done once every 3 years after age 76 if you are at a normal weight and without risk factors for diabetes. Testing should be considered at a younger age or be carried out more frequently if you are overweight and have at least 1 risk factor for diabetes.  Colorectal cancer can be detected and often prevented. Most routine colorectal cancer screening begins at the age of 49 and continues through age 61. However, your health care provider may recommend screening at an earlier age if you have risk factors for colon cancer. On a yearly basis, your health care provider may provide home test kits to check for hidden blood in the stool. A small camera at the end of a tube may be used to directly examine the colon (sigmoidoscopy or colonoscopy) to detect the earliest forms of colorectal cancer. Talk to your health care provider about this at age 47 when routine screening begins. A direct exam of the colon should be repeated every 5-10 years through age 62, unless early forms of precancerous polyps or small growths are found.  People who are at an increased risk for hepatitis B should be screened for this virus. You are considered at high risk for hepatitis B if:  You were born in a country where hepatitis B occurs often. Talk with your health care provider about which countries are considered high risk.  Your parents were born in a high-risk country and you have not received a shot to protect against hepatitis B (hepatitis B vaccine).  You have HIV or AIDS.  You use needles to inject street drugs.  You live with, or have sex with, someone who has hepatitis B.  You are a man who has sex with other men (MSM).  You get hemodialysis treatment.  You take certain medicines for conditions like cancer, organ transplantation, and autoimmune conditions.  Hepatitis C blood  testing is recommended for all people born from [birth year] through 1966 and any individual with known risk factors for hepatitis C.  Healthy men should no longer receive prostate-specific antigen (PSA) blood tests as part of routine cancer screening. Talk to your health care provider about prostate cancer screening.  Testicular cancer screening is not recommended for adolescents or adult males who have no symptoms. Screening includes self-exam, a health care provider exam, and other screening tests. Consult with your health care provider about any symptoms you have or any concerns you have about testicular cancer.  Practice safe sex. Use condoms and avoid high-risk sexual practices to reduce the spread of sexually transmitted infections (STIs).  You should be screened for STIs, including gonorrhea and chlamydia if:  You are sexually active and are younger than 24 years.  You are older than 24 years, and your health care provider tells you that you are at risk for this type of infection.  Your sexual activity has changed since you were last screened, and you are at an increased risk for chlamydia or gonorrhea. Ask your health care provider if you are at risk.  If you are at risk of being infected with HIV, it is recommended that you take a prescription medicine daily to prevent HIV infection. This is called pre-exposure prophylaxis (PrEP). You are considered at risk if:  You are a man who has sex with other men (MSM).  You are a heterosexual man who is sexually active with multiple partners.  You take drugs by injection.  You are sexually active with a partner who has HIV.  Talk with your health care provider about whether you are at high risk of being infected with HIV. If you choose to begin PrEP, you should first be tested for HIV. You should then be tested every 3 months for as long as you are taking PrEP.  Use sunscreen. Apply sunscreen liberally and repeatedly throughout the day. You  should seek shade when your shadow is shorter than you. Protect yourself by wearing long sleeves, pants, a wide-brimmed hat, and sunglasses year round whenever you are outdoors.  Tell your health care provider of new moles or changes in moles, especially if there is a change in shape or color. Also, tell your health care provider if a mole is larger than the size of a pencil eraser.  A one-time screening for abdominal aortic aneurysm (AAA) and surgical repair of large AAAs by ultrasound is recommended for men aged 6-75 years who are current or former smokers.  Stay current with your vaccines (immunizations). Document Released: 11/21/2007 Document Revised: 05/30/2013 Document Reviewed: 10/20/2010 [facility] Patient Information 2015 [place], [place]. This information is not intended to replace advice given to you by your health care provider. Make sure you discuss any questions you have with your health care provider.

## 2014-07-08 ENCOUNTER — Ambulatory Visit (HOSPITAL_BASED_OUTPATIENT_CLINIC_OR_DEPARTMENT_OTHER): Payer: BC Managed Care – PPO | Attending: Family

## 2014-07-08 DIAGNOSIS — G4730 Sleep apnea, unspecified: Secondary | ICD-10-CM | POA: Insufficient documentation

## 2014-07-19 DIAGNOSIS — G4733 Obstructive sleep apnea (adult) (pediatric): Secondary | ICD-10-CM

## 2014-07-19 NOTE — Progress Notes (Signed)
No appt needed

## 2014-07-19 NOTE — Sleep Study (Signed)
   NAME: Larry Hutchinson DATE OF BIRTH:  01-08-1966 MEDICAL RECORD NUMBER [id number]  LOCATION: [facility]  PHYSICIAN: Kathee Delton  DATE OF STUDY: 07/08/2014  SLEEP STUDY TYPE: Nocturnal Polysomnogram               REFERRING PHYSICIAN: Mauricio Po, FNP  INDICATION FOR STUDY: Hypersomnia with sleep apnea  EPWORTH SLEEPINESS SCORE:  13 HEIGHT: 5\' 9"  (175.3 cm)  WEIGHT: 250 lb (113.399 kg)    Body mass index is 36.9 kg/(m^2).  NECK SIZE: 18.5 in.  MEDICATIONS: Reviewed in the sleep record  SLEEP ARCHITECTURE: The patient had a total sleep time of 341 minutes with no slow-wave sleep and only 50 minutes of REM. Sleep onset latency was normal at 4 minutes, and REM onset was mildly prolonged at 125 minutes. Sleep efficiency was normal at 95%.  RESPIRATORY DATA: The patient was found to have 273 apneas and 155 obstructive hypopneas, giving him an AHI of 75 events per hour. The events occurred in all body positions, and there was loud snoring noted throughout.  OXYGEN DATA: There was oxygen desaturation as low as 73%.  CARDIAC DATA: Rare PVC noted  MOVEMENT/PARASOMNIA: There were no significant periodic limb movements or other abnormal behaviors noted.  IMPRESSION/ RECOMMENDATION:    1) severe obstructive sleep apnea/hypopnea syndrome, with an AHI of 75 events per hour and oxygen desaturation as low as 73%. Treatment for this degree of sleep apnea should focus on a trial of C Pap, as well as aggressive weight loss.  2) rare PVC noted, but no clinically significant arrhythmias were seen.    [place], American Board of Sleep Medicine  ELECTRONICALLY SIGNED ON:  07/19/2014, 9:03 AM [place] PH: [phone]   FX: [phone] [place]

## 2014-07-19 NOTE — Progress Notes (Signed)
Pt needs ov to review sleep study results.  

## 2014-07-30 ENCOUNTER — Telehealth: Payer: Self-pay | Admitting: Family

## 2014-07-30 DIAGNOSIS — G4730 Sleep apnea, unspecified: Secondary | ICD-10-CM

## 2014-07-30 NOTE — Telephone Encounter (Signed)
Please inform the patient that his sleep study results show that he does have sleep apnea and the recommendation is treatment with CPAP and aggressive weight loss. To set this up he will need to follow up with Dr. Gwenette Greet of Pulmonology. I have placed a referral if needed.

## 2014-07-30 NOTE — Telephone Encounter (Signed)
Pt aware of the results.

## 2014-07-30 NOTE — Telephone Encounter (Signed)
Is requesting results of sleep study

## 2014-08-28 ENCOUNTER — Encounter: Payer: Self-pay | Admitting: Family

## 2014-08-28 ENCOUNTER — Ambulatory Visit (INDEPENDENT_AMBULATORY_CARE_PROVIDER_SITE_OTHER): Payer: BLUE CROSS/BLUE SHIELD | Admitting: Family

## 2014-08-28 VITALS — BP 150/98 | HR 68 | Temp 97.6°F | Resp 18 | Wt 244.0 lb

## 2014-08-28 DIAGNOSIS — I10 Essential (primary) hypertension: Secondary | ICD-10-CM | POA: Diagnosis not present

## 2014-08-28 DIAGNOSIS — Z23 Encounter for immunization: Secondary | ICD-10-CM

## 2014-08-28 DIAGNOSIS — G4730 Sleep apnea, unspecified: Secondary | ICD-10-CM | POA: Diagnosis not present

## 2014-08-28 DIAGNOSIS — N529 Male erectile dysfunction, unspecified: Secondary | ICD-10-CM

## 2014-08-28 DIAGNOSIS — G4733 Obstructive sleep apnea (adult) (pediatric): Secondary | ICD-10-CM | POA: Insufficient documentation

## 2014-08-28 NOTE — Assessment & Plan Note (Signed)
Patient reports improvements since losing weight. Continue Viagra at current dosage as needed.

## 2014-08-28 NOTE — Patient Instructions (Addendum)
Thank you for choosing [facility].  Summary/Instructions:  Please follow up in 3 months to check you blood pressure and recheck blood work.   If your symptoms worsen or fail to improve, please contact our office for further instruction, or in case of emergency go directly to the emergency room at the closest medical facility.

## 2014-08-28 NOTE — Assessment & Plan Note (Signed)
Blood pressure slightly elevated today. Patient indicates his home readings are lower than 140/90 on a consistent basis. He has self discontinued taking his medications since the first week of January. Given lifestyle changes, continue without medication at this time and recheck in 3 months.

## 2014-08-28 NOTE — Assessment & Plan Note (Signed)
Stable at this time and has appointment to follow-up with Dr. Gwenette Greet in April. Follow up in primary care as needed.

## 2014-08-28 NOTE — Progress Notes (Signed)
   Subjective:    Patient ID: Larry Hutchinson, male    DOB: Mar 31, 1966, 49 y.o.   MRN: [id number]  Chief Complaint  Patient presents with  . Follow-up    has lost 34lbs since december and trying to eat better, not taking any meds at the moment    HPI:  Larry Hutchinson is a 49 y.o. male who presents today for follow up.   1) Sleep apnea - Sleep study was performed and has a scheduled with sleep medicine in April.   2) Blood pressure - Previously maintained on lisinopril-HCTZ. Self discontinued taking medication during the first week in January and notes his blood pressures at home ranging under 140s/90s.    BP Readings from Last 3 Encounters:  08/28/14 150/98  05/18/14 132/86  04/10/14 146/90    3) Erectile dysfunction - has improved with the weight loss, stable with Viagra.   Review of Systems  Eyes:       Negative for changes in vision  Respiratory: Negative for chest tightness and shortness of breath.   Cardiovascular: Negative for chest pain, palpitations and leg swelling.  Neurological: Negative for headaches.  Psychiatric/Behavioral: Positive for sleep disturbance.      Objective:    BP 150/98 mmHg  Pulse 68  Temp(Src) 97.6 F (36.4 C) (Oral)  Resp 18  Wt 244 lb (110.678 kg)  SpO2 95% Nursing note and vital signs reviewed.  Physical Exam  Constitutional: He is oriented to person, place, and time. He appears well-developed and well-nourished. No distress.  Cardiovascular: Normal rate, regular rhythm, normal heart sounds and intact distal pulses.   Pulmonary/Chest: Effort normal and breath sounds normal.  Neurological: He is alert and oriented to person, place, and time.  Skin: Skin is warm and dry.  Psychiatric: He has a normal mood and affect. His behavior is normal. Judgment and thought content normal.       Assessment & Plan:

## 2014-08-28 NOTE — Progress Notes (Signed)
Pre visit review using our clinic review tool, if applicable. No additional management support is needed unless otherwise documented below in the visit note. 

## 2014-08-29 ENCOUNTER — Telehealth: Payer: Self-pay | Admitting: Family

## 2014-08-29 NOTE — Telephone Encounter (Signed)
emmi emailed °

## 2014-09-19 ENCOUNTER — Ambulatory Visit (INDEPENDENT_AMBULATORY_CARE_PROVIDER_SITE_OTHER): Payer: BLUE CROSS/BLUE SHIELD | Admitting: Pulmonary Disease

## 2014-09-19 ENCOUNTER — Encounter: Payer: Self-pay | Admitting: Pulmonary Disease

## 2014-09-19 VITALS — BP 130/80 | HR 55 | Temp 97.9°F | Ht 68.0 in | Wt 239.2 lb

## 2014-09-19 DIAGNOSIS — G4733 Obstructive sleep apnea (adult) (pediatric): Secondary | ICD-10-CM

## 2014-09-19 NOTE — Assessment & Plan Note (Signed)
The patient has very severe obstructive sleep apnea by his recent sleep study, and will obviously need a positive pressure device for treatment. He has been on bilevel in the past, and has required extremely high pressure. He is obviously going to need a bilevel device because of the severity of his sleep apnea, and I will order this through a home care company. I have also stressed to him the importance of aggressive weight loss.

## 2014-09-19 NOTE — Progress Notes (Signed)
Subjective:    Patient ID: Larry Hutchinson, male    DOB: 03/15/1966, 49 y.o.   MRN: [id number]  HPI The patient is a 49 year old male who I've been asked to see for management of obstructive sleep apnea. He was diagnosed with sleep apnea years ago, and has been on a bilevel device. The device is no longer working properly, and his equipment is in disarray. He is been having loud snoring, witnessed apneas, as well as choking arousals with nonrestorative sleep. He is also been having inappropriate daytime sleepiness. His Epworth score today is 10, and he tells me that his weight is actually gone down 38 pounds since last year. He has had a recent follow-up study that showed very severe OSA, with an AHI of 75 events per hour.   Sleep Questionnaire What time do you typically go to bed?( Between what hours) 9-10p 9-10p at 1343 on 09/19/14 by Inge Rise, CMA How long does it take you to fall asleep? 15-20 min 15-20 min at 1343 on 09/19/14 by Inge Rise, CMA How many times during the night do you wake up? 3 3 at 1343 on 09/19/14 by Inge Rise, [place] What time do you get out of bed to start your day? 0500 0500 at 1343 on 09/19/14 by Inge Rise, CMA Do you drive or operate heavy machinery in your occupation? Yes Yes at 1343 on 09/19/14 by Inge Rise, CMA How much has your weight changed (up or down) over the past two years? (In pounds) 38 lb (17.237 kg) 38 lb (17.237 kg) at 1343 on 09/19/14 by Inge Rise, CMA Have you ever had a sleep study before? Yes Yes at 1343 on 09/19/14 by Inge Rise, CMA If yes, [facility] [place] [facility] at 1343 on 09/19/14 by Inge Rise, CMA If yes, date of study? 2009 and 2016 2009 and 2016 at 1343 on 09/19/14 by Inge Rise, CMA Do you currently use CPAP? Yes Yes at 1343 on 09/19/14 by Inge Rise, CMA If so, what pressure? bipap ?25/29 bipap ?25/29 at 1343 on 09/19/14 by Inge Rise, CMA Do you wear oxygen  at any time? No No at 1343 on 09/19/14 by Inge Rise, CMA   Review of Systems  Constitutional: Negative for fever and unexpected weight change.  HENT: Negative for congestion, dental problem, ear pain, nosebleeds, postnasal drip, rhinorrhea, sinus pressure, sneezing, sore throat and trouble swallowing.   Eyes: Negative for redness and itching.  Respiratory: Negative for cough, chest tightness, shortness of breath and wheezing.   Cardiovascular: Negative for palpitations and leg swelling.  Gastrointestinal: Negative for nausea and vomiting.  Genitourinary: Negative for dysuria.  Musculoskeletal: Negative for joint swelling.  Skin: Negative for rash.  Neurological: Negative for headaches.  Hematological: Does not bruise/bleed easily.  Psychiatric/Behavioral: Negative for dysphoric mood. The patient is not nervous/anxious.        Objective:   Physical Exam Constitutional:  obese, no acute distress  HENT:  Nares patent without discharge  Oropharynx without exudate, palate and uvula are thick and elongated.  Eyes:  Perrla, eomi, no scleral icterus  Neck:  No JVD, no TMG  Cardiovascular:  Normal rate, regular rhythm, no rubs or gallops.  No murmurs        Intact distal pulses  Pulmonary :  Normal breath sounds, no stridor or respiratory distress   No rales, rhonchi, or wheezing  Abdominal:  Soft, nondistended,  bowel sounds present.  No tenderness noted.   Musculoskeletal:  minimal lower extremity edema noted.  Lymph Nodes:  No cervical lymphadenopathy noted  Skin:  No cyanosis noted  Neurologic:  Alert, appropriate, moves all 4 extremities without obvious deficit.         Assessment & Plan:

## 2014-09-19 NOTE — Patient Instructions (Signed)
Will get you referred to a home care company for a bipap machine and new mask Work on weight loss followup with me again in 8 weeks to check on things, then will spread out to yearly if doing well.

## 2014-10-01 ENCOUNTER — Telehealth: Payer: Self-pay | Admitting: Pulmonary Disease

## 2014-10-01 DIAGNOSIS — G4733 Obstructive sleep apnea (adult) (pediatric): Secondary | ICD-10-CM

## 2014-10-01 NOTE — Telephone Encounter (Signed)
[facility]. [phone]  bcbs denied bipap study due to no titration study done in lab to prove cpap theryapy to cpap theryap prior to ordering bipap. called vernon at sleep lab to confirm no tiritations study on file. base line done 07/08/14. and pt did not return for any other study.  If [place] wants to appeal he will need to follow [employer] appeal rights in letter provided.

## 2014-10-01 NOTE — Telephone Encounter (Signed)
I would try to get it thru another dme company.  He has been on bilevel for years instead of cpap because of intolerance of cpap and high pressure needs.  If he has already been on bipap, he should be able to stay on bipap.

## 2014-10-02 NOTE — Telephone Encounter (Signed)
Spoke with  [place], [facility]. New order will need to be placed for DME referral. Order placed. PCC's aware.

## 2014-10-04 ENCOUNTER — Telehealth: Payer: Self-pay | Admitting: Pulmonary Disease

## 2014-10-04 NOTE — Telephone Encounter (Signed)
Called and spoke to pt. Pt stated he was denied by insurance to cover his bipap. Called and left a message at [facility] to see why pt was denied.

## 2014-10-04 NOTE — Telephone Encounter (Signed)
Issue has already been addressed in other phone note from 4/25. LMTCB for pt to inform him of change in DME.

## 2014-10-04 NOTE — Telephone Encounter (Signed)
Pt returning call.Larry Hutchinson ° °

## 2014-10-05 NOTE — Telephone Encounter (Signed)
Spoke with pt- was not upset.  Apologized for the delay in getting back with him Advised him that we have sent an order to switch his DME to [facility] and that as soon as this is taken care of someone will call him.  Pt is switching from [facility] to another "in network" DME  PCC's please advise. Thanks.

## 2014-10-05 NOTE — Telephone Encounter (Signed)
Pt is not happy no one returned his call yesterday. Please call back as soon as possible [phone]

## 2014-10-05 NOTE — Telephone Encounter (Signed)
i have spoke to this pt i am faxing an appeal to [facility] to see if we can get his bipap approved to get a new one pt is aware and i will contact him once i hear back from  [employer]

## 2014-10-09 NOTE — Telephone Encounter (Signed)
Libby please advise on any updates on this appeal.  Thanks!

## 2014-10-10 NOTE — Telephone Encounter (Signed)
Records have been faxed i dont know how long it will take but pt is aware of that Larry Hutchinson

## 2014-10-31 ENCOUNTER — Telehealth: Payer: Self-pay | Admitting: Pulmonary Disease

## 2014-10-31 DIAGNOSIS — G4733 Obstructive sleep apnea (adult) (pediatric): Secondary | ICD-10-CM

## 2014-10-31 NOTE — Telephone Encounter (Signed)
Per Libby/Dr Halford Chessman, place order for CPAP titration study - in notes specify to change to BiPAP if needed during study.  Pt is scheduled for study 11/08/14. Pt has spoken with [facility] today and is aware.  Nothing further needed.

## 2014-11-08 ENCOUNTER — Telehealth: Payer: Self-pay | Admitting: Pulmonary Disease

## 2014-11-08 ENCOUNTER — Ambulatory Visit (HOSPITAL_BASED_OUTPATIENT_CLINIC_OR_DEPARTMENT_OTHER): Payer: BLUE CROSS/BLUE SHIELD | Attending: Pulmonary Disease | Admitting: Radiology

## 2014-11-08 VITALS — Ht 68.0 in | Wt 227.0 lb

## 2014-11-08 DIAGNOSIS — G4733 Obstructive sleep apnea (adult) (pediatric): Secondary | ICD-10-CM | POA: Insufficient documentation

## 2014-11-08 NOTE — Telephone Encounter (Signed)
Pt is still waiting on appeal from [facility] for new bipap nad is just having bipap titration tonight.  Does he need to keep appt with [facility] on 11/14/14 since nothing has been done since last ov.  Please advise

## 2014-11-09 NOTE — Telephone Encounter (Signed)
We need to see him 8 weeks after he gets his new machine.  I guess he will need to see Dr. Halford Chessman.

## 2014-11-09 NOTE — Telephone Encounter (Signed)
[facility] - patient is still waiting for BiPAP, does he still need to keep his appointment on 6/8?  Please advise.

## 2014-11-09 NOTE — Telephone Encounter (Signed)
Pt is aware of KC's recommendation. Once he gets his new machine he will call us to set up 8 week ROV with [facility]. Nothing further was needed at this time.

## 2014-11-14 ENCOUNTER — Ambulatory Visit: Payer: BLUE CROSS/BLUE SHIELD | Admitting: Pulmonary Disease

## 2014-11-16 ENCOUNTER — Telehealth: Payer: Self-pay | Admitting: Pulmonary Disease

## 2014-11-16 DIAGNOSIS — G4733 Obstructive sleep apnea (adult) (pediatric): Secondary | ICD-10-CM

## 2014-11-16 NOTE — Sleep Study (Signed)
[place]  NAME: [name] DATE OF BIRTH:  February 16, 1966 MEDICAL RECORD NUMBER [id number]  LOCATION: [facility]  PHYSICIAN: Chesley Mires, M.D. DATE OF STUDY: 11/08/2014  SLEEP STUDY TYPE: BiPAP titration study.               REFERRING PHYSICIAN: Chesley Mires, MD  INDICATION FOR STUDY:  Larry Hutchinson is a 49 y.o. male who had sleep study 07/08/14 which showed severe OSA (AHI 75).  He returns to the sleep lab for a titration study.  EPWORTH SLEEPINESS SCORE: 13. HEIGHT: 5\' 8"  (172.7 cm)  WEIGHT: 227 lb (102.967 kg)    Body mass index is 34.52 kg/(m^2).  NECK SIZE: 19 in.  MEDICATIONS:  No current outpatient prescriptions on file prior to visit.   No current facility-administered medications on file prior to visit.    SLEEP ARCHITECTURE:  Total recording time: 432 minutes.  Total sleep time was: 391 minutes.  Sleep efficiency: 90.5%.  Sleep latency: 9 minutes.  REM latency: 59.5 minutes.  Stage N1: 10.9%.  Stage N2: 66.1%.  Stage N3: 0%.  Stage R:  23%.  Supine sleep: 97 minutes.  Non-supine sleep: 294 minutes.  CARDIAC DATA:  Average heart rate: 51 beats per minute. Rhythm strip: sinus rhythm with PVC's and PAC's.  RESPIRATORY DATA: Average respiratory rate: 15.  He was started on BiPAP 8/4 and increased to 17/13 cm H2O.  With lower pressures he had frequent central apneic events.  These resolved with higher pressures, and his obstructive events were controlled with BiPAP 17/13 cm H2O.  At this pressure his AHI was 0.4, and he was observed in REM and supine sleep.  MOVEMENT/PARASOMNIA:  Periodic limb movement: 13.  Period limb movements with arousals: 0.2. Restroom trips: None.  OXYGEN DATA:  Baseline oxygenation: 94%. Lowest SaO2: 78%. Time spent below SaO2 90%: 14.4 minutes. Supplemental oxygen used: None.  IMPRESSION/ RECOMMENDATION:   This was a successful titration study.  He did well with BiPAP 17/13 cm H2O.  He  had frequent central events with lower pressures.  He was fitted with a Fisher Paykel large size simplus full face mask.   Chesley Mires, M.D. Diplomate, Tax adviser of Sleep Medicine  ELECTRONICALLY SIGNED ON:  11/16/2014, 11:14 AM [place] PH: [phone]   FX: [phone] [place]

## 2014-11-16 NOTE — Telephone Encounter (Signed)
BiPAP 11/08/14 >> BiPAP 17/13 cm H2O >> AHI 0.4, +R, +S   Will have my nurse inform pt that he did well with BiPAP during sleep study.  Please set up BiPAP 17/13 cm H2O with heated humidity and mask of choice.  Schedule ROV 2 months after getting BiPAP.

## 2014-11-20 NOTE — Telephone Encounter (Signed)
[phone], pt cb

## 2014-11-20 NOTE — Telephone Encounter (Signed)
Results have been explained to patient, pt expressed understanding.  Order placed for BIPAP start.  ecall entered for 2 mo ROV.  Nothing further needed.

## 2014-11-20 NOTE — Telephone Encounter (Signed)
LMTCB x 1 

## 2014-11-23 ENCOUNTER — Telehealth: Payer: Self-pay | Admitting: Pulmonary Disease

## 2014-11-23 NOTE — Telephone Encounter (Signed)
Called and spoke to [place]ston from [employer], she needed a copy of CPAP Titration study faxed to her.   Faxed copy. Nothing further needed.

## 2014-11-30 ENCOUNTER — Encounter: Payer: Self-pay | Admitting: Family

## 2014-11-30 ENCOUNTER — Ambulatory Visit (INDEPENDENT_AMBULATORY_CARE_PROVIDER_SITE_OTHER): Payer: BLUE CROSS/BLUE SHIELD | Admitting: Family

## 2014-11-30 VITALS — BP 130/86 | HR 71 | Temp 98.4°F | Resp 18 | Ht 68.0 in | Wt 234.0 lb

## 2014-11-30 DIAGNOSIS — I10 Essential (primary) hypertension: Secondary | ICD-10-CM | POA: Diagnosis not present

## 2014-11-30 DIAGNOSIS — N529 Male erectile dysfunction, unspecified: Secondary | ICD-10-CM

## 2014-11-30 MED ORDER — SILDENAFIL CITRATE 20 MG PO TABS: 20.0000 mg | ORAL_TABLET | Freq: Three times a day (TID) | ORAL | Status: DC

## 2014-11-30 NOTE — Progress Notes (Signed)
Pre visit review using our clinic review tool, if applicable. No additional management support is needed unless otherwise documented below in the visit note. 

## 2014-11-30 NOTE — Assessment & Plan Note (Signed)
Erectile dysfunction is stable with current dosage of sildenafil. Continue current dosage of sildenafil as needed. Follow up if symptoms worsen or fail to improve.

## 2014-11-30 NOTE — Patient Instructions (Signed)
Thank you for choosing [facility].  Summary/Instructions:  Your prescription(s) have been submitted to your pharmacy or been printed and provided for you. Please take as directed and contact our office if you believe you are having problem(s) with the medication(s) or have any questions.  If your symptoms worsen or fail to improve, please contact our office for further instruction, or in case of emergency go directly to the emergency room at the closest medical facility.     

## 2014-11-30 NOTE — Assessment & Plan Note (Signed)
Stable with lifestyle management. Continue without medication and follow up in 6 months for annual physical.

## 2014-11-30 NOTE — Progress Notes (Signed)
   Subjective:    Patient ID: Larry Hutchinson, male    DOB: Jun 23, 1965, 49 y.o.   MRN: [id number]  Chief Complaint  Patient presents with  . Follow-up    HPI:  Larry Hutchinson is a 49 y.o. male with a PMH of hypertension, obstructive sleep apnea, stricture and stenosis of the esophagus, and erectile dysfunction who presents today for an office follow-up.  1.) Hypertension - Previously self-discontinued his medication and has been maintained on lifestyle changes. Reports that his blood pressure has been maintained around 130s. Denies any adverse effects. Noted significant weight loss of 48 lbs.   BP Readings from Last 3 Encounters:  11/30/14 130/86  09/19/14 130/80  08/28/14 150/98    Wt Readings from Last 3 Encounters:  11/30/14 234 lb (106.142 kg)  11/08/14 227 lb (102.967 kg)  09/19/14 239 lb 3.2 oz (108.5 kg)   2.) Erectile dysfunction - Continues to experience occasional dysfunction which is relieved by sildenafil. Requesting refill of medication.  No Known Allergies  No current outpatient prescriptions on file prior to visit.   No current facility-administered medications on file prior to visit.    Review of Systems  Eyes:       Negative for changes in vision.   Respiratory: Negative for chest tightness and shortness of breath.   Cardiovascular: Negative for chest pain, palpitations and leg swelling.  Genitourinary:       Positive for erectile dysfunction.   Neurological: Negative for headaches.      Objective:    BP 130/86 mmHg  Pulse 71  Temp(Src) 98.4 F (36.9 C) (Oral)  Resp 18  Ht 5\' 8"  (1.727 m)  Wt 234 lb (106.142 kg)  BMI 35.59 kg/m2  SpO2 97% Nursing note and vital signs reviewed.  Physical Exam  Constitutional: He is oriented to person, place, and time. He appears well-developed and well-nourished. No distress.  Cardiovascular: Normal rate, regular rhythm, normal heart sounds and intact distal pulses.   Pulmonary/Chest: Effort normal and  breath sounds normal.  Neurological: He is alert and oriented to person, place, and time.  Skin: Skin is warm and dry.  Psychiatric: He has a normal mood and affect. His behavior is normal. Judgment and thought content normal.       Assessment & Plan:   Problem List Items Addressed This Visit      Cardiovascular and Mediastinum   Essential hypertension - Primary    Stable with lifestyle management. Continue without medication and follow up in 6 months for annual physical.       Relevant Medications   sildenafil (REVATIO) 20 MG tablet     Genitourinary   Erectile dysfunction    Erectile dysfunction is stable with current dosage of sildenafil. Continue current dosage of sildenafil as needed. Follow up if symptoms worsen or fail to improve.       Relevant Medications   sildenafil (REVATIO) 20 MG tablet

## 2015-01-22 ENCOUNTER — Telehealth: Payer: Self-pay | Admitting: Pulmonary Disease

## 2015-01-22 NOTE — Telephone Encounter (Signed)
Spoke with pt wife, states that he is having issues getting his BiPAP machine.  Pt failed CPAP therapy and is needing BiPAP. Pt wife states that she spoke with [employer] prior to order being placed and was told that this would be covered and just needed to be ordered.  Pt has had 2 sleep studies and they still have not received any equipment. Needing someone to speak with [employer] on behalf of patient for BiPAP coverage - needs Courtesy Review by Physician. [employer] contact # [phone] Claim # [id number]  Will send to [facility] to assist in this process - is this a Pre-Cert or CMN issue?

## 2015-01-23 NOTE — Telephone Encounter (Signed)
This is a former [facility] pt and has no pending appt yet. Please advise Dr. Halford Chessman per libby message below. thanks

## 2015-01-23 NOTE — Telephone Encounter (Signed)
i dont think [employer] wants to speack to [place] i think they want a MD to call them Larry Hutchinson

## 2015-01-28 NOTE — Telephone Encounter (Signed)
Pt failed CPAP therapy and is needing BiPAP. Pt wife states that she spoke with [employer] prior to order being placed and was told that this would be covered and just needed to be ordered.  Pt has had 2 sleep studies and they still have not received any equipment. Needing someone to speak with [employer] on behalf of patient for BiPAP coverage - needs Courtesy Review by Physician. [employer] contact # [phone] Claim # [id number]  Dr. Halford Chessman - please advise.

## 2015-01-30 NOTE — Telephone Encounter (Signed)
Message sent to Dr. Halford Chessman for peer to peer

## 2015-02-04 NOTE — Telephone Encounter (Signed)
Dr. Halford Chessman, has this Peer to Peer been done? Please advise if there is anything further we need to do on this. Thanks.

## 2015-02-07 NOTE — Telephone Encounter (Signed)
Dr Halford Chessman does plan to try to take care of this  Through peer to peer Joellen Jersey

## 2015-02-12 NOTE — Telephone Encounter (Signed)
Routed message to VS to advise on status of Peer to Peer and sent message to Sharyn Lull to remind VS as she is working with [facility] this morning.

## 2015-03-04 ENCOUNTER — Telehealth: Payer: Self-pay | Admitting: Pulmonary Disease

## 2015-03-04 NOTE — Telephone Encounter (Signed)
Called and spoke to pt. Informed pt we do not have openings at this time with Dr. Halford Chessman. Informed pt that a recall will be placed to schedule next available appt. Pt requested an appt be make now. Appt made with CY in 06/2015. Pt verbalized understanding and denied any further questions or concerns at this time.

## 2015-03-04 NOTE — Telephone Encounter (Signed)
Pt sent e-mail.   I need to schedule a follow-up appointment in a month from today's date with Pulmonary. Dr. Gwenette Greet was my doctor but now I think it is Dr. Garnet Koyanagi(?). I have been on a Bipap for one month now and I was told by the pulmonary office to visit after I had been on it for two months.

## 2015-06-28 ENCOUNTER — Ambulatory Visit (INDEPENDENT_AMBULATORY_CARE_PROVIDER_SITE_OTHER): Payer: BLUE CROSS/BLUE SHIELD | Admitting: Internal Medicine

## 2015-06-28 ENCOUNTER — Encounter: Payer: Self-pay | Admitting: Internal Medicine

## 2015-06-28 VITALS — BP 124/86 | HR 63 | Ht 68.0 in | Wt 238.0 lb

## 2015-06-28 DIAGNOSIS — K222 Esophageal obstruction: Secondary | ICD-10-CM

## 2015-06-28 DIAGNOSIS — Z23 Encounter for immunization: Secondary | ICD-10-CM

## 2015-06-28 DIAGNOSIS — G4733 Obstructive sleep apnea (adult) (pediatric): Secondary | ICD-10-CM

## 2015-06-28 NOTE — Assessment & Plan Note (Signed)
He avoids steak but otherwise recognizes no problems. I advised him to maintain contact with GI for access to care, since his previous gastroenterologist has left the practice.

## 2015-06-28 NOTE — Assessment & Plan Note (Signed)
He is very satisfied with current device, pressures, mask and DME company. We discussed comfort options and mask choices.

## 2015-06-28 NOTE — Progress Notes (Signed)
Subjective:    Patient ID: Larry Hutchinson, male    DOB: Apr 27, 1966, 50 y.o.   MRN: [id number]  HPI 09/19/14- Dr Gwenette Greet The patient is a 50 year old male who I've been asked to see for management of obstructive sleep apnea. He was diagnosed with sleep apnea years ago, and has been on a bilevel device. The device is no longer working properly, and his equipment is in disarray. He is been having loud snoring, witnessed apneas, as well as choking arousals with nonrestorative sleep. He is also been having inappropriate daytime sleepiness. His Epworth score today is 10, and he tells me that his weight is actually gone down 38 pounds since last year. He has had a recent follow-up study that showed very severe OSA, with an AHI of 75 events per hou  06/28/2015- 50 year old male followed for OSA, complicated by GERD/esophageal stricture, HBP NPSG 07/08/14 AHI  75/ hr, desat to 73%, Weight 250 lbs On BIPAP since 2009- doesn't tolerate CPAP at high pressures  BIPAP VPAP: IPAP max 17, EPAP mean 13, PS4/ [facility] He is quite satisfied with current settings and DME company. Download confirms excellent compliance and control He sleeps well with no snoring and quality of life is definitely better using PAP  ROS-see HPI   Negative unless "+" Constitutional:    weight loss, night sweats, fevers, chills, fatigue, lassitude. HEENT:    headaches, difficulty swallowing, tooth/dental problems, sore throat,       sneezing, itching, ear ache, nasal congestion, post nasal drip, snoring CV:    chest pain, orthopnea, PND, swelling in lower extremities, anasarca,                                                    dizziness, palpitations Resp:   shortness of breath with exertion or at rest.                productive cough,   non-productive cough, coughing up of blood.              change in color of mucus.  wheezing.   Skin:    rash or lesions. GI:  No-   heartburn, indigestion, abdominal pain, nausea, vomiting, diarrhea,                  change in bowel habits, loss of appetite GU: dysuria, change in color of urine, no urgency or frequency.   flank pain. MS:   joint pain, stiffness, decreased range of motion, back pain. Neuro-     nothing unusual Psych:  change in mood or affect.  depression or anxiety.   memory loss.    Objective:  OBJ- Physical Exam General- Alert, Oriented, Affect-appropriate, Distress- none acute, + stocky Skin- rash-none, lesions- none, excoriation- none Lymphadenopathy- none Head- atraumatic            Eyes- Gross vision intact, PERRLA, conjunctivae and secretions clear            Ears- Hearing, canals-normal            Nose- Clear, no-Septal dev, mucus, polyps, erosion, perforation             Throat- Mallampati IV , mucosa clear , drainage- none, tonsils- atrophic Neck- flexible , trachea midline, no stridor , thyroid nl, carotid no bruit Chest - symmetrical excursion , unlabored  Heart/CV- RRR , no murmur , no gallop  , no rub, nl s1 s2                           - JVD- none , edema- none, stasis changes- none, varices- none           Lung- clear to P&A, wheeze- none, cough- none , dullness-none, rub- none           Chest wall-  Abd-  Br/ Gen/ Rectal- Not done, not indicated Extrem- cyanosis- none, clubbing, none, atrophy- none, strength- nl Neuro- grossly intact to observation           Assessment & Plan:

## 2015-06-28 NOTE — Patient Instructions (Signed)
We can continue BIPAP/ VPAP auto 17/13, / [facility]  Order- DME [facility]-  Renewal of existing orders to continue BIPAP/VPAP 17/13 PS4, mask of choice, humidifier, supplies, AirView    Dx OSA  Please call if we can help

## 2016-01-03 DIAGNOSIS — G4733 Obstructive sleep apnea (adult) (pediatric): Secondary | ICD-10-CM | POA: Diagnosis not present

## 2016-03-12 ENCOUNTER — Encounter: Payer: Self-pay | Admitting: Family

## 2016-03-19 ENCOUNTER — Other Ambulatory Visit (INDEPENDENT_AMBULATORY_CARE_PROVIDER_SITE_OTHER): Payer: BLUE CROSS/BLUE SHIELD

## 2016-03-19 ENCOUNTER — Ambulatory Visit (INDEPENDENT_AMBULATORY_CARE_PROVIDER_SITE_OTHER): Payer: BLUE CROSS/BLUE SHIELD | Admitting: Family

## 2016-03-19 ENCOUNTER — Encounter: Payer: Self-pay | Admitting: Family

## 2016-03-19 VITALS — BP 162/94 | HR 63 | Temp 97.6°F | Resp 18 | Ht 68.0 in | Wt 255.8 lb

## 2016-03-19 DIAGNOSIS — Z0000 Encounter for general adult medical examination without abnormal findings: Secondary | ICD-10-CM

## 2016-03-19 DIAGNOSIS — Z1211 Encounter for screening for malignant neoplasm of colon: Secondary | ICD-10-CM | POA: Diagnosis not present

## 2016-03-19 DIAGNOSIS — Z0001 Encounter for general adult medical examination with abnormal findings: Secondary | ICD-10-CM

## 2016-03-19 DIAGNOSIS — M25562 Pain in left knee: Secondary | ICD-10-CM | POA: Diagnosis not present

## 2016-03-19 DIAGNOSIS — Z6838 Body mass index (BMI) 38.0-38.9, adult: Secondary | ICD-10-CM | POA: Diagnosis not present

## 2016-03-19 DIAGNOSIS — IMO0001 Reserved for inherently not codable concepts without codable children: Secondary | ICD-10-CM

## 2016-03-19 DIAGNOSIS — G8929 Other chronic pain: Secondary | ICD-10-CM | POA: Diagnosis not present

## 2016-03-19 DIAGNOSIS — E6609 Other obesity due to excess calories: Secondary | ICD-10-CM

## 2016-03-19 DIAGNOSIS — R972 Elevated prostate specific antigen [PSA]: Secondary | ICD-10-CM

## 2016-03-19 LAB — CBC
HEMATOCRIT: 47.3 % (ref 39.0–52.0)
HEMOGLOBIN: 16.3 g/dL (ref 13.0–17.0)
MCHC: 34.5 g/dL (ref 30.0–36.0)
MCV: 82.5 fl (ref 78.0–100.0)
Platelets: 289 10*3/uL (ref 150.0–400.0)
RBC: 5.73 Mil/uL (ref 4.22–5.81)
RDW: 13.3 % (ref 11.5–15.5)
WBC: 10 10*3/uL (ref 4.0–10.5)

## 2016-03-19 LAB — COMPREHENSIVE METABOLIC PANEL
ALBUMIN: 3.9 g/dL (ref 3.5–5.2)
ALT: 33 U/L (ref 0–53)
AST: 19 U/L (ref 0–37)
Alkaline Phosphatase: 87 U/L (ref 39–117)
BILIRUBIN TOTAL: 0.6 mg/dL (ref 0.2–1.2)
BUN: 17 mg/dL (ref 6–23)
CALCIUM: 9.5 mg/dL (ref 8.4–10.5)
CHLORIDE: 107 meq/L (ref 96–112)
CO2: 28 mEq/L (ref 19–32)
CREATININE: 1.16 mg/dL (ref 0.40–1.50)
GFR: 70.39 mL/min (ref >60.00)
Glucose, Bld: 96 mg/dL (ref 70–99)
Potassium: 4.1 mEq/L (ref 3.5–5.1)
Sodium: 142 mEq/L (ref 135–145)
Total Protein: 6.1 g/dL (ref 6.0–8.3)

## 2016-03-19 LAB — LIPID PANEL
CHOLESTEROL: 162 mg/dL (ref 0–200)
HDL: 42.8 mg/dL (ref >39.00)
LDL Cholesterol: 99 mg/dL (ref 0–99)
NonHDL: 119.07
TRIGLYCERIDES: 101 mg/dL (ref 0.0–149.0)
Total CHOL/HDL Ratio: 4
VLDL: 20.2 mg/dL (ref 0.0–40.0)

## 2016-03-19 LAB — PSA: PSA: 5.12 ng/mL — AB (ref 0.10–4.00)

## 2016-03-19 NOTE — Assessment & Plan Note (Signed)
Left knee pain is consistent with possible meniscal pathology/inflammation. Treat conservatively with ice, home exercise therapy, and referral to orthopedics place per patient request. Follow-up if symptoms worsen or do not improve.

## 2016-03-19 NOTE — Assessment & Plan Note (Signed)
1) Anticipatory Guidance: Discussed importance of wearing a seatbelt while driving and not texting while driving; changing batteries in smoke detector at least once annually; wearing suntan lotion when outside; eating a balanced and moderate diet; getting physical activity at least 30 minutes per day.  2) Immunizations / Screenings / Labs:  Declines influenza. All other immunizations are up-to-date per recommendations. Obtain PSA for prostate cancer screening. Due for colon cancer screening with referral to gastroenterology placed. Due for a dental screen encouraged to be completed independently. All other screenings are up-to-date per recommendations. Obtain CBC, CMET, and lipid profile.  Overall well exam with risk factors for cardiovascular disease including obesity and hypertension. Obesity is also contribute eating factor to his obstructive sleep apnea. Recommend weight loss of 5-10% of current body weight through nutrition and physical activity. Blood pressure slightly elevated today encouraged decrease sodium in diet as patient does not wish to take blood pressure medications at this time. Continue other healthy lifestyle behaviors and choices. Follow-up prevention exam in 1 year. Follow-up office visit pending blood work.

## 2016-03-19 NOTE — Assessment & Plan Note (Signed)
BMI of 38. Recommend weight loss of 5-10% of current body weight. Recommend increasing physical activity to 30 minutes of moderate level activity daily. Encourage nutritional intake that focuses on nutrient dense foods and is moderate, varied, and balanced and is low in saturated fats and processed/sugary foods. Continue to monitor.

## 2016-03-19 NOTE — Progress Notes (Signed)
Subjective:    Patient ID: Larry Hutchinson, male    DOB: August 07, 1965, 50 y.o.   MRN: [id number]  Chief Complaint  Patient presents with  . CPE    fasting, referral to ortho to have left knee looked at    HPI:  Larry Hutchinson is a 50 y.o. male who presents today for an annual wellness visit.   1) Health Maintenance -   Diet - Averaging about 3 meals per day consisting of a regular diet. Caffeine intake of about 2-3 cups per day  Exercise - 3x per week; mixtures of cardio and resistance training.    2) Preventative Exams / Immunizations:  Dental -- Due for exam  Vision -- Up to date   Health Maintenance  Topic Date Due  . HIV Screening  08/28/1979  . COLONOSCOPY  08/28/2014  . INFLUENZA VACCINE  09/05/2016 (Originally 01/07/2016)  . TETANUS/TDAP  08/27/2024    Immunization History  Administered Date(s) Administered  . Td 08/28/2014     3.) Left knee pain - This is a new problem. Associated symptom of pain located in his left knee has been going on for about 4 months following twisting it while in his boat. Describes a popping sensation when it happened. Denies any modifying factors or current treatments. Overall the course of symptoms has improved, but continues to have difficulty with steps on occasion. Would like a referral to orthopedics.   No Known Allergies   Outpatient Medications Prior to Visit  Medication Sig Dispense Refill  . sildenafil (REVATIO) 20 MG tablet Take 1 tablet (20 mg total) by mouth 3 (three) times daily. 40 tablet 0   No facility-administered medications prior to visit.      Past Medical History:  Diagnosis Date  . Dysphagia 2009  . GERD (gastroesophageal reflux disease) 2009  . Hypertension   . Sleep apnea    uses CPAP at night     Past Surgical History:  Procedure Laterality Date  . APPENDECTOMY  age 72  . CARDIAC CATHETERIZATION  2009  . ESOPHAGOGASTRODUODENOSCOPY N/A 02/07/2014   Procedure: ESOPHAGOGASTRODUODENOSCOPY (EGD);   Surgeon: Inda Castle, MD;  Location: [facility];  Service: Endoscopy;  Laterality: N/A;  . facial reconstructive surgery    . FOREIGN BODY REMOVAL N/A 02/07/2014   Procedure: FOREIGN BODY REMOVAL;  Surgeon: Inda Castle, MD;  Location: [facility];  Service: Endoscopy;  Laterality: N/A;  . NASAL SINUS SURGERY    . WRIST SURGERY Right    repair of trauma involving flexor tendons.      Family History  Problem Relation Age of Onset  . Adopted: Yes  . Leukemia Maternal Grandmother   . Heart disease Paternal Grandmother   . Stroke Paternal Grandmother   . Heart disease Paternal Grandfather   . Lupus Paternal Grandfather   . Leukemia Paternal Grandfather      Social History   Social History  . Marital status: Married    Spouse name: N/A  . Number of children: 2  . Years of education: 12   Occupational History  . tool and dye fabricator [employer]   Social History Main Topics  . Smoking status: Former Smoker    Packs/day: 1.50    Years: 25.00    Types: Cigarettes, Cigars    Quit date: 04/11/2007  . Smokeless tobacco: Former Systems developer    Types: Snuff, Chew  . Alcohol use 0.0 oz/week     Comment: rare glass of wine a few times  per month.   . Drug use: No  . Sexual activity: Not on file   Other Topics Concern  . Not on file   Social History Narrative   Born and raised in [place], [place]. Currently resides in a private residence with his wife. 3 dogs.    Fun: Read (favorite fantasy), fish   Denies religious beliefs that would effect healthcare.           Review of Systems  Constitutional: Denies fever, chills, fatigue, or significant weight gain/loss. HENT: Head: Denies headache or neck pain Ears: Denies changes in hearing, ringing in ears, earache, drainage Nose: Denies discharge, stuffiness, itching, nosebleed, sinus pain Throat: Denies sore throat, hoarseness, dry mouth, sores, thrush Eyes: Denies loss/changes in vision, pain, redness,  blurry/double vision, flashing lights Cardiovascular: Denies chest pain/discomfort, tightness, palpitations, shortness of breath with activity, difficulty lying down, swelling, sudden awakening with shortness of breath Respiratory: Denies shortness of breath, cough, sputum production, wheezing Gastrointestinal: Denies dysphasia, heartburn, change in appetite, nausea, change in bowel habits, rectal bleeding, constipation, diarrhea, yellow skin or eyes Genitourinary: Denies frequency, urgency, burning/pain, blood in urine, incontinence, change in urinary strength. Musculoskeletal: Denies muscle/joint pain, stiffness, back pain, redness or swelling of joints, trauma Skin: Denies rashes, lumps, itching, dryness, color changes, or hair/nail changes Neurological: Denies dizziness, fainting, seizures, weakness, numbness, tingling, tremor Psychiatric - Denies nervousness, stress, depression or memory loss Endocrine: Denies heat or cold intolerance, sweating, frequent urination, excessive thirst, changes in appetite Hematologic: Denies ease of bruising or bleeding     Objective:    BP (!) 162/94 (BP Location: Left Arm, Patient Position: Sitting, Cuff Size: Large)   Pulse 63   Temp 97.6 F (36.4 C) (Oral)   Resp 18   Ht 5\' 8"  (1.727 m)   Wt 255 lb 12.8 oz (116 kg)   SpO2 94%   BMI 38.89 kg/m  Nursing note and vital signs reviewed.  Physical Exam  Constitutional: He is oriented to person, place, and time. He appears well-developed and well-nourished.  HENT:  Head: Normocephalic.  Right Ear: Hearing, tympanic membrane, external ear and ear canal normal.  Left Ear: Hearing, tympanic membrane, external ear and ear canal normal.  Nose: Nose normal.  Mouth/Throat: Uvula is midline, oropharynx is clear and moist and mucous membranes are normal.  Eyes: Conjunctivae and EOM are normal. Pupils are equal, round, and reactive to light.  Neck: Neck supple. No JVD present. No tracheal deviation present.  No thyromegaly present.  Cardiovascular: Normal rate, regular rhythm, normal heart sounds and intact distal pulses.   Pulmonary/Chest: Effort normal and breath sounds normal.  Abdominal: Soft. Bowel sounds are normal. He exhibits no distension and no mass. There is no tenderness. There is no rebound and no guarding.  Musculoskeletal: Normal range of motion. He exhibits no edema or tenderness.  Left knee - no obvious deformity, discoloration, or edema. Tenderness elicited over medial joint line. Range of motion and strength are normal. Distal pulses and sensation are intact and appropriate. Ligamentous testing is negative. Questionable discomfort with meniscal testing.  Lymphadenopathy:    He has no cervical adenopathy.  Neurological: He is alert and oriented to person, place, and time. He has normal reflexes. No cranial nerve deficit. He exhibits normal muscle tone. Coordination normal.  Skin: Skin is warm and dry.  Psychiatric: He has a normal mood and affect. His behavior is normal. Judgment and thought content normal.       Assessment & Plan:   Problem List  Items Addressed This Visit      Other   Encounter for general adult medical examination with abnormal findings - Primary    1) Anticipatory Guidance: Discussed importance of wearing a seatbelt while driving and not texting while driving; changing batteries in smoke detector at least once annually; wearing suntan lotion when outside; eating a balanced and moderate diet; getting physical activity at least 30 minutes per day.  2) Immunizations / Screenings / Labs:  Declines influenza. All other immunizations are up-to-date per recommendations. Obtain PSA for prostate cancer screening. Due for colon cancer screening with referral to gastroenterology placed. Due for a dental screen encouraged to be completed independently. All other screenings are up-to-date per recommendations. Obtain CBC, CMET, and lipid profile.  Overall well exam with  risk factors for cardiovascular disease including obesity and hypertension. Obesity is also contribute eating factor to his obstructive sleep apnea. Recommend weight loss of 5-10% of current body weight through nutrition and physical activity. Blood pressure slightly elevated today encouraged decrease sodium in diet as patient does not wish to take blood pressure medications at this time. Continue other healthy lifestyle behaviors and choices. Follow-up prevention exam in 1 year. Follow-up office visit pending blood work.         Relevant Orders   Ambulatory referral to Gastroenterology   CBC   Comprehensive metabolic panel   PSA   Lipid panel   Left knee pain    Left knee pain is consistent with possible meniscal pathology/inflammation. Treat conservatively with ice, home exercise therapy, and referral to orthopedics place per patient request. Follow-up if symptoms worsen or do not improve.      Relevant Orders   AMB referral to orthopedics   Obesity    BMI of 38. Recommend weight loss of 5-10% of current body weight. Recommend increasing physical activity to 30 minutes of moderate level activity daily. Encourage nutritional intake that focuses on nutrient dense foods and is moderate, varied, and balanced and is low in saturated fats and processed/sugary foods. Continue to monitor.        Other Visit Diagnoses    Colon cancer screening       Relevant Orders   Ambulatory referral to Gastroenterology       I have discontinued Mr. Eckstrom's sildenafil.   Follow-up: Return in about 3 months (around 06/19/2016), or if symptoms worsen or fail to improve.   Mauricio Po, FNP

## 2016-03-19 NOTE — Patient Instructions (Addendum)
Thank you for choosing [facility].  SUMMARY AND INSTRUCTIONS:  They will call to schedule your appointment with orthopedics.   Please continue to use your CPAP.   Labs:  Please stop by the lab on the lower level of the building for your blood work. Your results will be released to [place] (or called to you) after review, usually within 72 hours after test completion. If any changes need to be made, you will be notified at that same time.  1.) The lab is open from 7:30am to 5:30 pm Monday-Friday 2.) No appointment is necessary 3.) Fasting (if needed) is 6-8 hours after food and drink; black coffee and water are okay   Health Maintenance, Male A healthy lifestyle and preventative care can promote health and wellness.  Maintain regular health, dental, and eye exams.  Eat a healthy diet. Foods like vegetables, fruits, whole grains, low-fat dairy products, and lean protein foods contain the nutrients you need and are low in calories. Decrease your intake of foods high in solid fats, added sugars, and salt. Get information about a proper diet from your health care provider, if necessary.  Regular physical exercise is one of the most important things you can do for your health. Most adults should get at least 150 minutes of moderate-intensity exercise (any activity that increases your heart rate and causes you to sweat) each week. In addition, most adults need muscle-strengthening exercises on 2 or more days a week.   Maintain a healthy weight. The body mass index (BMI) is a screening tool to identify possible weight problems. It provides an estimate of body fat based on height and weight. Your health care provider can find your BMI and can help you achieve or maintain a healthy weight. For males 20 years and older:  A BMI below 18.5 is considered underweight.  A BMI of 18.5 to 24.9 is normal.  A BMI of 25 to 29.9 is considered overweight.  A BMI of 30 and above is considered  obese.  Maintain normal blood lipids and cholesterol by exercising and minimizing your intake of saturated fat. Eat a balanced diet with plenty of fruits and vegetables. Blood tests for lipids and cholesterol should begin at age 64 and be repeated every 5 years. If your lipid or cholesterol levels are high, you are over age 79, or you are at high risk for heart disease, you may need your cholesterol levels checked more frequently.Ongoing high lipid and cholesterol levels should be treated with medicines if diet and exercise are not working.  If you smoke, find out from your health care provider how to quit. If you do not use tobacco, do not start.  Lung cancer screening is recommended for adults aged 34-80 years who are at high risk for developing lung cancer because of a history of smoking. A yearly low-dose CT scan of the lungs is recommended for people who have at least a 30-pack-year history of smoking and are current smokers or have quit within the past 15 years. A pack year of smoking is smoking an average of 1 pack of cigarettes a day for 1 year (for example, a 30-pack-year history of smoking could mean smoking 1 pack a day for 30 years or 2 packs a day for 15 years). Yearly screening should continue until the smoker has stopped smoking for at least 15 years. Yearly screening should be stopped for people who develop a health problem that would prevent them from having lung cancer treatment.  If  you choose to drink alcohol, do not have more than 2 drinks per day. One drink is considered to be 12 oz (360 mL) of beer, 5 oz (150 mL) of wine, or 1.5 oz (45 mL) of liquor.  Avoid the use of street drugs. Do not share needles with anyone. Ask for help if you need support or instructions about stopping the use of drugs.  High blood pressure causes heart disease and increases the risk of stroke. High blood pressure is more likely to develop in:  People who have blood pressure in the end of the normal  range (100-139/85-89 mm Hg).  People who are overweight or obese.  People who are African American.  If you are 69-43 years of age, have your blood pressure checked every 3-5 years. If you are 28 years of age or older, have your blood pressure checked every year. You should have your blood pressure measured twice--once when you are at a hospital or clinic, and once when you are not at a hospital or clinic. Record the average of the two measurements. To check your blood pressure when you are not at a hospital or clinic, you can use:  An automated blood pressure machine at a pharmacy.  A home blood pressure monitor.  If you are 93-25 years old, ask your health care provider if you should take aspirin to prevent heart disease.  Diabetes screening involves taking a blood sample to check your fasting blood sugar level. This should be done once every 3 years after age 47 if you are at a normal weight and without risk factors for diabetes. Testing should be considered at a younger age or be carried out more frequently if you are overweight and have at least 1 risk factor for diabetes.  Colorectal cancer can be detected and often prevented. Most routine colorectal cancer screening begins at the age of 37 and continues through age 7. However, your health care provider may recommend screening at an earlier age if you have risk factors for colon cancer. On a yearly basis, your health care provider may provide home test kits to check for hidden blood in the stool. A small camera at the end of a tube may be used to directly examine the colon (sigmoidoscopy or colonoscopy) to detect the earliest forms of colorectal cancer. Talk to your health care provider about this at age 55 when routine screening begins. A direct exam of the colon should be repeated every 5-10 years through age 78, unless early forms of precancerous polyps or small growths are found.  People who are at an increased risk for hepatitis B  should be screened for this virus. You are considered at high risk for hepatitis B if:  You were born in a country where hepatitis B occurs often. Talk with your health care provider about which countries are considered high risk.  Your parents were born in a high-risk country and you have not received a shot to protect against hepatitis B (hepatitis B vaccine).  You have HIV or AIDS.  You use needles to inject street drugs.  You live with, or have sex with, someone who has hepatitis B.  You are a man who has sex with other men (MSM).  You get hemodialysis treatment.  You take certain medicines for conditions like cancer, organ transplantation, and autoimmune conditions.  Hepatitis C blood testing is recommended for all people born from [birth year] through 1966 and any individual with known risk factors for hepatitis C.  Healthy men should no longer receive prostate-specific antigen (PSA) blood tests as part of routine cancer screening. Talk to your health care provider about prostate cancer screening.  Testicular cancer screening is not recommended for adolescents or adult males who have no symptoms. Screening includes self-exam, a health care provider exam, and other screening tests. Consult with your health care provider about any symptoms you have or any concerns you have about testicular cancer.  Practice safe sex. Use condoms and avoid high-risk sexual practices to reduce the spread of sexually transmitted infections (STIs).  You should be screened for STIs, including gonorrhea and chlamydia if:  You are sexually active and are younger than 24 years.  You are older than 24 years, and your health care provider tells you that you are at risk for this type of infection.  Your sexual activity has changed since you were last screened, and you are at an increased risk for chlamydia or gonorrhea. Ask your health care provider if you are at risk.  If you are at risk of being infected with  HIV, it is recommended that you take a prescription medicine daily to prevent HIV infection. This is called pre-exposure prophylaxis (PrEP). You are considered at risk if:  You are a man who has sex with other men (MSM).  You are a heterosexual man who is sexually active with multiple partners.  You take drugs by injection.  You are sexually active with a partner who has HIV.  Talk with your health care provider about whether you are at high risk of being infected with HIV. If you choose to begin PrEP, you should first be tested for HIV. You should then be tested every 3 months for as long as you are taking PrEP.  Use sunscreen. Apply sunscreen liberally and repeatedly throughout the day. You should seek shade when your shadow is shorter than you. Protect yourself by wearing long sleeves, pants, a wide-brimmed hat, and sunglasses year round whenever you are outdoors.  Tell your health care provider of new moles or changes in moles, especially if there is a change in shape or color. Also, tell your health care provider if a mole is larger than the size of a pencil eraser.  A one-time screening for abdominal aortic aneurysm (AAA) and surgical repair of large AAAs by ultrasound is recommended for men aged 75-75 years who are current or former smokers.  Stay current with your vaccines (immunizations).   This information is not intended to replace advice given to you by your health care provider. Make sure you discuss any questions you have with your health care provider.   Document Released: 11/21/2007 Document Revised: 06/15/2014 Document Reviewed: 10/20/2010 Elsevier Interactive Patient Education [employer].

## 2016-03-26 ENCOUNTER — Encounter: Payer: Self-pay | Admitting: Family

## 2016-03-26 ENCOUNTER — Other Ambulatory Visit (INDEPENDENT_AMBULATORY_CARE_PROVIDER_SITE_OTHER): Payer: BLUE CROSS/BLUE SHIELD

## 2016-03-26 DIAGNOSIS — R972 Elevated prostate specific antigen [PSA]: Secondary | ICD-10-CM | POA: Diagnosis not present

## 2016-03-26 LAB — PSA: PSA: 3.92 ng/mL (ref 0.10–4.00)

## 2016-03-27 ENCOUNTER — Encounter: Payer: Self-pay | Admitting: Gastroenterology

## 2016-03-30 DIAGNOSIS — M25562 Pain in left knee: Secondary | ICD-10-CM | POA: Diagnosis not present

## 2016-03-30 DIAGNOSIS — M222X1 Patellofemoral disorders, right knee: Secondary | ICD-10-CM | POA: Diagnosis not present

## 2016-04-08 DIAGNOSIS — M25562 Pain in left knee: Secondary | ICD-10-CM | POA: Diagnosis not present

## 2016-04-15 DIAGNOSIS — M25562 Pain in left knee: Secondary | ICD-10-CM | POA: Diagnosis not present

## 2016-04-23 DIAGNOSIS — G8929 Other chronic pain: Secondary | ICD-10-CM | POA: Diagnosis not present

## 2016-04-23 DIAGNOSIS — M25562 Pain in left knee: Secondary | ICD-10-CM | POA: Diagnosis not present

## 2016-05-12 ENCOUNTER — Ambulatory Visit (AMBULATORY_SURGERY_CENTER): Payer: Self-pay

## 2016-05-12 VITALS — Ht 68.0 in | Wt 261.4 lb

## 2016-05-12 DIAGNOSIS — Z1211 Encounter for screening for malignant neoplasm of colon: Secondary | ICD-10-CM

## 2016-05-12 MED ORDER — NA SULFATE-K SULFATE-MG SULF 17.5-3.13-1.6 GM/177ML PO SOLN: ORAL | 0 refills | Status: DC

## 2016-05-12 NOTE — Progress Notes (Signed)
Per pt, no allergies to soy or egg products.Pt not taking any weight loss meds or using  O2 at home. 

## 2016-05-15 ENCOUNTER — Encounter: Payer: Self-pay | Admitting: Gastroenterology

## 2016-05-26 ENCOUNTER — Encounter: Payer: Self-pay | Admitting: Gastroenterology

## 2016-05-26 ENCOUNTER — Ambulatory Visit (AMBULATORY_SURGERY_CENTER): Payer: BLUE CROSS/BLUE SHIELD | Admitting: Gastroenterology

## 2016-05-26 VITALS — BP 110/77 | HR 71 | Temp 99.1°F | Resp 18 | Ht 68.0 in | Wt 261.0 lb

## 2016-05-26 DIAGNOSIS — D122 Benign neoplasm of ascending colon: Secondary | ICD-10-CM

## 2016-05-26 DIAGNOSIS — K635 Polyp of colon: Secondary | ICD-10-CM | POA: Diagnosis not present

## 2016-05-26 DIAGNOSIS — K5140 Inflammatory polyps of colon without complications: Secondary | ICD-10-CM | POA: Diagnosis not present

## 2016-05-26 DIAGNOSIS — Z1212 Encounter for screening for malignant neoplasm of rectum: Secondary | ICD-10-CM

## 2016-05-26 DIAGNOSIS — Z1211 Encounter for screening for malignant neoplasm of colon: Secondary | ICD-10-CM | POA: Diagnosis present

## 2016-05-26 DIAGNOSIS — D123 Benign neoplasm of transverse colon: Secondary | ICD-10-CM

## 2016-05-26 DIAGNOSIS — D124 Benign neoplasm of descending colon: Secondary | ICD-10-CM

## 2016-05-26 LAB — HM COLONOSCOPY

## 2016-05-26 MED ORDER — SODIUM CHLORIDE 0.9 % IV SOLN: 500.0000 mL | INTRAVENOUS | Status: DC

## 2016-05-26 NOTE — Progress Notes (Signed)
Called to room to assist during endoscopic procedure.  Patient ID and intended procedure confirmed with present staff. Received instructions for my participation in the procedure from the performing physician.  

## 2016-05-26 NOTE — Progress Notes (Signed)
Report given to PACU RN, vss 

## 2016-05-26 NOTE — Op Note (Signed)
[facility] Patient Name: Larry Hutchinson Procedure Date: 05/26/2016 1:56 PM MRN: [id number] Endoscopist: Remo Lipps P. Armbruster MD, MD Age: 50 Referring MD:  Date of Birth: 1966-04-26 Gender: Male Account #: [id number] Procedure:                Colonoscopy Indications:              Screening for malignant neoplasm in the colon, This                            is the patient's first colonoscopy Medicines:                Monitored Anesthesia Care Procedure:                Pre-Anesthesia Assessment:                           - Prior to the procedure, a History and Physical                            was performed, and patient medications and                            allergies were reviewed. The patient's tolerance of                            previous anesthesia was also reviewed. The risks                            and benefits of the procedure and the sedation                            options and risks were discussed with the patient.                            All questions were answered, and informed consent                            was obtained. Prior Anticoagulants: The patient has                            taken no previous anticoagulant or antiplatelet                            agents. ASA Grade Assessment: II - A patient with                            mild systemic disease. After reviewing the risks                            and benefits, the patient was deemed in                            satisfactory condition to undergo the procedure.  After obtaining informed consent, the colonoscope                            was passed under direct vision. Throughout the                            procedure, the patient's blood pressure, pulse, and                            oxygen saturations were monitored continuously. The                            Model CF-HQ190L [id number]) scope was introduced                            through the  anus and advanced to the the cecum,                            identified by appendiceal orifice and ileocecal                            valve. The colonoscopy was performed without                            difficulty. The patient tolerated the procedure                            well. The quality of the bowel preparation was                            good. The ileocecal valve, appendiceal orifice, and                            rectum were photographed. Scope In: 2:06:12 PM Scope Out: 2:29:27 PM Scope Withdrawal Time: 0 hours 19 minutes 43 seconds  Total Procedure Duration: 0 hours 23 minutes 15 seconds  Findings:                 The perianal and digital rectal examinations were                            normal.                           Many medium-mouthed diverticula were found in the                            sigmoid colon.                           A 6 mm polyp was found in the ascending colon. The                            polyp was sessile. The polyp was removed with a  cold snare. Resection and retrieval were complete.                           A 15 mm polyp was found in the hepatic flexure. The                            polyp was sessile. The polyp was removed with a                            cold snare. Resection and retrieval were complete.                           Four sessile polyps were found in the transverse                            colon. The polyps were 4 to 8 mm in size. These                            polyps were removed with a cold snare. Resection                            and retrieval were complete.                           A 5 mm polyp was found in the descending colon - it                            had a small head but a long stalk. The polyp was                            pedunculated and consistent with a benign                            inflammatory polyp. The polyp was removed with a                            hot  snare. Resection and retrieval were complete.                           Internal hemorrhoids were found during retroflexion.                           The exam was otherwise without abnormality. Complications:            No immediate complications. Estimated blood loss:                            Minimal. Estimated Blood Loss:     Estimated blood loss was minimal. Impression:               - Diverticulosis in the sigmoid colon.                           -  One 6 mm polyp in the ascending colon, removed                            with a cold snare. Resected and retrieved.                           - One 15 mm polyp at the hepatic flexure, removed                            with a cold snare. Resected and retrieved.                           - Four 4 to 8 mm polyps in the transverse colon,                            removed with a cold snare. Resected and retrieved.                           - One 5 mm polyp in the descending colon, removed                            with a hot snare. Resected and retrieved.                           - Internal hemorrhoids.                           - The examination was otherwise normal. Recommendation:           - Patient has a contact number available for                            emergencies. The signs and symptoms of potential                            delayed complications were discussed with the                            patient. Return to normal activities tomorrow.                            Written discharge instructions were provided to the                            patient.                           - Resume previous diet.                           - Continue present medications.                           - No ibuprofen, naproxen, or other non-steroidal  anti-inflammatory drugs for 2 weeks after polyp                            removal.                           - Await pathology results.                           -  Repeat colonoscopy is recommended for                            surveillance. The colonoscopy date will be                            determined after pathology results from today's                            exam become available for review. Remo Lipps P. Armbruster MD, MD 05/26/2016 2:34:30 PM This report has been signed electronically.

## 2016-05-26 NOTE — Patient Instructions (Signed)
YOU HAD AN ENDOSCOPIC PROCEDURE TODAY AT [facility]:   Refer to the procedure report that was given to you for any specific questions about what was found during the examination.  If the procedure report does not answer your questions, please call your gastroenterologist to clarify.  If you requested that your care partner not be given the details of your procedure findings, then the procedure report has been included in a sealed envelope for you to review at your convenience later.  YOU SHOULD EXPECT: Some feelings of bloating in the abdomen. Passage of more gas than usual.  Walking can help get rid of the air that was put into your GI tract during the procedure and reduce the bloating. If you had a lower endoscopy (such as a colonoscopy or flexible sigmoidoscopy) you may notice spotting of blood in your stool or on the toilet paper. If you underwent a bowel prep for your procedure, you may not have a normal bowel movement for a few days.  Please Note:  You might notice some irritation and congestion in your nose or some drainage.  This is from the oxygen used during your procedure.  There is no need for concern and it should clear up in a day or so.  SYMPTOMS TO REPORT IMMEDIATELY:   Following lower endoscopy (colonoscopy or flexible sigmoidoscopy):  Excessive amounts of blood in the stool  Significant tenderness or worsening of abdominal pains  Swelling of the abdomen that is new, acute  Fever of 100F or higher   For urgent or emergent issues, a gastroenterologist can be reached at any hour by calling [phone].  Please read all handouts given to you by your recovery nurse. No Ibuprofen, NSAIDS or ASA products for 2 weeks.   DIET:  We do recommend a small meal at first, but then you may proceed to your regular diet.  Drink plenty of fluids but you should avoid alcoholic beverages for 24 hours.  ACTIVITY:  You should plan to take it easy for the rest of today and you  should NOT DRIVE or use heavy machinery until tomorrow (because of the sedation medicines used during the test).    FOLLOW UP: Our staff will call the number listed on your records the next business day following your procedure to check on you and address any questions or concerns that you may have regarding the information given to you following your procedure. If we do not reach you, we will leave a message.  However, if you are feeling well and you are not experiencing any problems, there is no need to return our call.  We will assume that you have returned to your regular daily activities without incident.  If any biopsies were taken you will be contacted by phone or by letter within the next 1-3 weeks.  Please call us at [phone] if you have not heard about the biopsies in 3 weeks.    SIGNATURES/CONFIDENTIALITY: You and/or your care partner have signed paperwork which will be entered into your electronic medical record.  These signatures attest to the fact that that the information above on your After Visit Summary has been reviewed and is understood.  Full responsibility of the confidentiality of this discharge information lies with you and/or your care-partner.  Thank you for letting us take care of your healthcare needs today.

## 2016-05-27 ENCOUNTER — Telehealth: Payer: Self-pay

## 2016-05-27 NOTE — Telephone Encounter (Signed)
Left message on answering machine. 

## 2016-06-03 ENCOUNTER — Encounter: Payer: Self-pay | Admitting: Gastroenterology

## 2016-06-03 DIAGNOSIS — M222X1 Patellofemoral disorders, right knee: Secondary | ICD-10-CM | POA: Diagnosis not present

## 2016-06-03 DIAGNOSIS — M222X2 Patellofemoral disorders, left knee: Secondary | ICD-10-CM | POA: Diagnosis not present

## 2016-06-23 DIAGNOSIS — R509 Fever, unspecified: Secondary | ICD-10-CM | POA: Diagnosis not present

## 2016-06-23 DIAGNOSIS — J209 Acute bronchitis, unspecified: Secondary | ICD-10-CM | POA: Diagnosis not present

## 2016-06-23 DIAGNOSIS — J0100 Acute maxillary sinusitis, unspecified: Secondary | ICD-10-CM | POA: Diagnosis not present

## 2016-06-26 ENCOUNTER — Ambulatory Visit: Payer: BLUE CROSS/BLUE SHIELD | Admitting: Internal Medicine

## 2016-07-07 ENCOUNTER — Encounter: Payer: Self-pay | Admitting: Internal Medicine

## 2016-07-08 ENCOUNTER — Ambulatory Visit (INDEPENDENT_AMBULATORY_CARE_PROVIDER_SITE_OTHER): Payer: BLUE CROSS/BLUE SHIELD | Admitting: Internal Medicine

## 2016-07-08 ENCOUNTER — Encounter: Payer: Self-pay | Admitting: Internal Medicine

## 2016-07-08 ENCOUNTER — Ambulatory Visit (INDEPENDENT_AMBULATORY_CARE_PROVIDER_SITE_OTHER): Payer: BLUE CROSS/BLUE SHIELD | Admitting: Family

## 2016-07-08 DIAGNOSIS — I10 Essential (primary) hypertension: Secondary | ICD-10-CM

## 2016-07-08 DIAGNOSIS — G4733 Obstructive sleep apnea (adult) (pediatric): Secondary | ICD-10-CM

## 2016-07-08 DIAGNOSIS — I160 Hypertensive urgency: Secondary | ICD-10-CM | POA: Insufficient documentation

## 2016-07-08 MED ORDER — CLONIDINE HCL 0.1 MG PO TABS
0.2000 mg | ORAL_TABLET | Freq: Once | ORAL | Status: AC
  Administered 2016-07-08: 0.2 mg via ORAL

## 2016-07-08 MED ORDER — AMLODIPINE BESYLATE 10 MG PO TABS: 10.0000 mg | ORAL_TABLET | Freq: Every day | ORAL | 2 refills | Status: DC

## 2016-07-08 NOTE — Progress Notes (Signed)
Subjective:    Patient ID: Larry Hutchinson, male    DOB: March 03, 1966, 51 y.o.   MRN: [id number]  Chief Complaint  Patient presents with  . Hypertension    just noticed it today and is having a headache    HPI:  Larry Hutchinson is a 51 y.o. male who  has a past medical history of Dysphagia (2009); GERD (gastroesophageal reflux disease) (2009); Hypertension; and Sleep apnea. and presents today for an acute office visit.   This is a new problem. Associated symptom of elevated blood pressure with associated headache has been going on since this morning. He was at a pulmonology appointment when he was noted to have a blood pressure of 170/120. Denies any changes to vision or other symptoms of end organ damage. Denies worst headache of life.   BP Readings from Last 3 Encounters:  07/08/16 (!) 150/92  07/08/16 (!) 170/120  05/26/16 110/77     No Known Allergies    Outpatient Medications Prior to Visit  Medication Sig Dispense Refill  . aspirin 325 MG tablet Take 650 mg by mouth once.    [name] ibuprofen (ADVIL,MOTRIN) 200 MG tablet Take 200 mg by mouth every 6 (six) hours as needed.     No facility-administered medications prior to visit.       Past Surgical History:  Procedure Laterality Date  . APPENDECTOMY  age 8  . CARDIAC CATHETERIZATION  2009  . ESOPHAGOGASTRODUODENOSCOPY N/A 02/07/2014   Procedure: ESOPHAGOGASTRODUODENOSCOPY (EGD);  Surgeon: Inda Castle, MD;  Location: [facility];  Service: Endoscopy;  Laterality: N/A;  . facial reconstructive surgery  [phone]  . FOREIGN BODY REMOVAL N/A 02/07/2014   Procedure: FOREIGN BODY REMOVAL;  Surgeon: Inda Castle, MD;  Location: [facility];  Service: Endoscopy;  Laterality: N/A;  . NASAL SINUS SURGERY  1992  . WRIST SURGERY Right    repair of trauma involving flexor tendons.       Past Medical History:  Diagnosis Date  . Dysphagia 2009  . GERD (gastroesophageal reflux disease) 2009  . Hypertension    diet  controlled  . Sleep apnea    uses CPAP at night      Review of Systems  Constitutional: Negative for chills and fever.  Eyes:       Negative for changes in vision  Respiratory: Negative for cough, chest tightness and wheezing.   Cardiovascular: Negative for chest pain, palpitations and leg swelling.  Neurological: Positive for headaches. Negative for dizziness, weakness and light-headedness.      Objective:    BP (!) 150/92   Pulse 72   Resp 14   Ht 5\' 8"  (1.727 m)   Wt 263 lb (119.3 kg)   SpO2 97%   BMI 39.99 kg/m  Nursing note and vital signs reviewed.  Physical Exam  Constitutional: He is oriented to person, place, and time. He appears well-developed and well-nourished. No distress.  Eyes: Conjunctivae and EOM are normal. Pupils are equal, round, and reactive to light.  Neck: Neck supple.  Cardiovascular: Normal rate, regular rhythm, normal heart sounds and intact distal pulses.   Pulmonary/Chest: Effort normal and breath sounds normal.  Neurological: He is alert and oriented to person, place, and time. He has normal reflexes. No cranial nerve deficit.  Skin: Skin is warm and dry.  Psychiatric: He has a normal mood and affect. His behavior is normal. Judgment and thought content normal.       Assessment & Plan:   Problem List Items  Addressed This Visit      Cardiovascular and Mediastinum   Hypertensive urgency    Symptoms and exam consistent with hypertensive urgency with mild headache. Denies worst headache of life with no symptoms of end organ damage noted on physical exam. Patient given total of 0.2 mg of clonidine and monitored in office with serial blood pressures and upon leaving office with a blood pressure of 150/92. Start amlodipine. Encouraged to monitor blood pressure at home and follow low-sodium diet. Encouraged weight loss. Follow-up in 2 weeks for blood pressure check or sooner if symptoms worsen or do not improve.      Relevant Medications    amLODipine (NORVASC) 10 MG tablet       I am having Mr. Cleckley start on amLODipine. I am also having him maintain his ibuprofen and aspirin.   Meds ordered this encounter  Medications  . amLODipine (NORVASC) 10 MG tablet    Sig: Take 1 tablet (10 mg total) by mouth daily.    Dispense:  30 tablet    Refill:  2    Order Specific Question:   Supervising Provider    Answer:   Pricilla Holm A [id number]     Follow-up: Return in about 2 weeks (around 07/22/2016), or if symptoms worsen or fail to improve.  Mauricio Po, FNP

## 2016-07-08 NOTE — Patient Instructions (Signed)
Thank you for choosing [facility].  SUMMARY AND INSTRUCTIONS:  Please check your blood pressure at home.  Start the amlodipine.  Follow up nurse visit in 2 weeks.   Medication:  Your prescription(s) have been submitted to your pharmacy or been printed and provided for you. Please take as directed and contact our office if you believe you are having problem(s) with the medication(s) or have any questions.    Follow up:  If your symptoms worsen or fail to improve, please contact our office for further instruction, or in case of emergency go directly to the emergency room at the closest medical facility.   DASH Eating Plan DASH stands for "Dietary Approaches to Stop Hypertension." The DASH eating plan is a healthy eating plan that has been shown to reduce high blood pressure (hypertension). Additional health benefits may include reducing the risk of type 2 diabetes mellitus, heart disease, and stroke. The DASH eating plan may also help with weight loss. What do I need to know about the DASH eating plan? For the DASH eating plan, you will follow these general guidelines:  Choose foods with less than 150 milligrams of sodium per serving (as listed on the food label).  Use salt-free seasonings or herbs instead of table salt or sea salt.  Check with your health care provider or pharmacist before using salt substitutes.  Eat lower-sodium products. These are often labeled as "low-sodium" or "no salt added."  Eat fresh foods. Avoid eating a lot of canned foods.  Eat more vegetables, fruits, and low-fat dairy products.  Choose whole grains. Look for the word "whole" as the first word in the ingredient list.  Choose fish and skinless chicken or [place] more often than red meat. Limit fish, poultry, and meat to 6 oz (170 g) each day.  Limit sweets, desserts, sugars, and sugary drinks.  Choose heart-healthy fats.  Eat more home-cooked food and less restaurant, buffet, and fast  food.  Limit fried foods.  Do not fry foods. Cook foods using methods such as baking, boiling, grilling, and broiling instead.  When eating at a restaurant, ask that your food be prepared with less salt, or no salt if possible. What foods can I eat? Seek help from a dietitian for individual calorie needs. Grains  Whole grain or whole wheat bread. Brown rice. Whole grain or whole wheat pasta. Quinoa, bulgur, and whole grain cereals. Low-sodium cereals. Corn or whole wheat flour tortillas. Whole grain cornbread. Whole grain crackers. Low-sodium crackers. Vegetables  Fresh or frozen vegetables (raw, steamed, roasted, or grilled). Low-sodium or reduced-sodium tomato and vegetable juices. Low-sodium or reduced-sodium tomato sauce and paste. Low-sodium or reduced-sodium canned vegetables. Fruits  All fresh, canned (in natural juice), or frozen fruits. Meat and Other Protein Products  Ground beef (85% or leaner), grass-fed beef, or beef trimmed of fat. Skinless chicken or [place]. Ground chicken or [place]. Pork trimmed of fat. All fish and seafood. Eggs. Dried beans, peas, or lentils. Unsalted nuts and seeds. Unsalted canned beans. Dairy  Low-fat dairy products, such as skim or 1% milk, 2% or reduced-fat cheeses, low-fat ricotta or cottage cheese, or plain low-fat yogurt. Low-sodium or reduced-sodium cheeses. Fats and Oils  Tub margarines without trans fats. Light or reduced-fat mayonnaise and salad dressings (reduced sodium). Avocado. Safflower, olive, or canola oils. Natural peanut or almond butter. Other  Unsalted popcorn and pretzels. The items listed above may not be a complete list of recommended foods or beverages. Contact your dietitian for more options.  What foods are not recommended? Grains  White bread. White pasta. White rice. Refined cornbread. Bagels and croissants. Crackers that contain trans fat. Vegetables  Creamed or fried vegetables. Vegetables in a cheese sauce. Regular  canned vegetables. Regular canned tomato sauce and paste. Regular tomato and vegetable juices. Fruits  Canned fruit in light or heavy syrup. Fruit juice. Meat and Other Protein Products  Fatty cuts of meat. Ribs, chicken wings, bacon, sausage, bologna, salami, chitterlings, fatback, hot dogs, bratwurst, and packaged luncheon meats. Salted nuts and seeds. Canned beans with salt. Dairy  Whole or 2% milk, cream, half-and-half, and cream cheese. Whole-fat or sweetened yogurt. Full-fat cheeses or blue cheese. Nondairy creamers and whipped toppings. Processed cheese, cheese spreads, or cheese curds. Condiments  Onion and garlic salt, seasoned salt, table salt, and sea salt. Canned and packaged gravies. Worcestershire sauce. Tartar sauce. Barbecue sauce. Teriyaki sauce. Soy sauce, including reduced sodium. Steak sauce. Fish sauce. Oyster sauce. Cocktail sauce. Horseradish. Ketchup and mustard. Meat flavorings and tenderizers. Bouillon cubes. Hot sauce. Tabasco sauce. Marinades. Taco seasonings. Relishes. Fats and Oils  Butter, stick margarine, lard, shortening, ghee, and bacon fat. Coconut, palm kernel, or palm oils. Regular salad dressings. Other  Pickles and olives. Salted popcorn and pretzels. The items listed above may not be a complete list of foods and beverages to avoid. Contact your dietitian for more information.  Where can I find more information? National Heart, Lung, and Blood Institute: travelstabloid.com This information is not intended to replace advice given to you by your health care provider. Make sure you discuss any questions you have with your health care provider. Document Released: 05/14/2011 Document Revised: 10/31/2015 Document Reviewed: 03/29/2013 Elsevier Interactive Patient Education  2017 [employer].

## 2016-07-08 NOTE — Assessment & Plan Note (Signed)
Symptoms and exam consistent with hypertensive urgency with mild headache. Denies worst headache of life with no symptoms of end organ damage noted on physical exam. Patient given total of 0.2 mg of clonidine and monitored in office with serial blood pressures and upon leaving office with a blood pressure of 150/92. Start amlodipine. Encouraged to monitor blood pressure at home and follow low-sodium diet. Encouraged weight loss. Follow-up in 2 weeks for blood pressure check or sooner if symptoms worsen or do not improve.

## 2016-07-08 NOTE — Addendum Note (Signed)
Addended by: Delice Bison E on: 07/08/2016 02:00 PM   Modules accepted: Orders

## 2016-07-08 NOTE — Patient Instructions (Signed)
We can continue VPAP 17/ 13, PS 4/ [employer] , mask of choice, humidifier, supplies , AirView    Dx OSA  We are asking your PCP office downstairs to take a look at your BP today. It might be up from the prednisone, although you stopped that a week ago.   Please call if we can help

## 2016-07-08 NOTE — Addendum Note (Signed)
Addended by: Virl Cagey on: 07/08/2016 12:23 PM   Modules accepted: Orders

## 2016-07-08 NOTE — Assessment & Plan Note (Signed)
BP is too high today, possibly reflecting prednisone ending a week ago. Complaining of headache and malaise. We contacted primary care downstairs and they are going to working in right after this visit.

## 2016-07-08 NOTE — Assessment & Plan Note (Signed)
Control is good at current settings so we will not make changes. He couldn't wear his machine for 2 nights when bronchitis was acute and still notices a little residual burning in the mornings after wearing VPAP. He is not quite emptying his humidifier reservoir, but close with winter heat on. This reflects residual from his bronchitis. I suggested he stand a little longer and steamy shower each morning and this feeling should clear.

## 2016-07-08 NOTE — Progress Notes (Signed)
Subjective:    Patient ID: Larry Hutchinson, male    DOB: 09-Mar-1966, 51 y.o.   MRN: [id number]  HPI  male former smoker followed for OSA, complicated by GERD/esophageal stricture, HBP NPSG 07/08/14 AHI  75/ hr, desat to 73%, Weight 250 lbs  ------------------------------------------------------------------------------  06/28/2015- 51 year old male followed for OSA, complicated by GERD/esophageal stricture, HBP, obesity NPSG 07/08/14 AHI  75/ hr, desat to 73%, Weight 250 lbs On BIPAP since 2009- doesn't tolerate CPAP at high pressures  BIPAP VPAP: IPAP max 17, EPAP mean 13, PS4/ [facility] He is quite satisfied with current settings and DME company. Download confirms excellent compliance and control He sleeps well with no snoring and quality of life is definitely better using PAP  07/08/2016-51 year old male former smoker followed for OSA, complicated by GERD/esophageal stricture, HBP, obesity 172/118 on arrival with HA   263 lbs "I don't feel right" FOLLOWS FOR: Wears CPAP nightly. Denies problems with mask/pressure. DME: [facility]. He feels he has done extremely well with his VPAP at current settings. Couldn't wear it for a couple of nights 2 weeks ago when he had a bad bronchitis but much better now. Since then has been waking in the mornings with "burning" feeling in his chest as if he been running hard but little residual cough. Sleeping well with no reports of snoring.  ROS-see HPI   + = pos Constitutional:    weight loss, night sweats, fevers, chills, fatigue, lassitude. + malaise" HEENT:    +headaches, difficulty swallowing, tooth/dental problems, sore throat,       sneezing, itching, ear ache, nasal congestion, post nasal drip, snoring CV:    chest pain, orthopnea, PND, swelling in lower extremities, anasarca,                                                    dizziness, palpitations Resp:   shortness of breath with exertion or at rest.                productive cough,  +  non-productive cough, coughing up of blood.              change in color of mucus.  wheezing.   Skin:    rash or lesions. GI:  No-   heartburn, indigestion, abdominal pain, nausea, vomiting, diarrhea,                 change in bowel habits, loss of appetite GU: dysuria, change in color of urine, no urgency or frequency.   flank pain. MS:   joint pain, stiffness, decreased range of motion, back pain. Neuro-     nothing unusual Psych:  change in mood or affect.  depression or anxiety.   memory loss.    Objective:  OBJ- Physical Exam General- Alert, Oriented, Affect-appropriate, Distress- none acute, +Obese    170/120 Skin- rash-none, lesions- none, excoriation- none Lymphadenopathy- none Head- atraumatic            Eyes- Gross vision intact, PERRLA, conjunctivae and secretions clear            Ears- Hearing, canals-normal            Nose- Clear, no-Septal dev, mucus, polyps, erosion, perforation             Throat- Mallampati IV , mucosa clear , drainage- none, tonsils- atrophic  Neck- flexible , trachea midline, no stridor , thyroid nl, carotid no bruit Chest - symmetrical excursion , unlabored           Heart/CV- RRR , no murmur , no gallop  , no rub, nl s1 s2                           - JVD- none , edema- none, stasis changes- none, varices- none           Lung- clear to P&A, wheeze- none, cough+ dry , dullness-none, rub- none           Chest wall-  Abd-  Br/ Gen/ Rectal- Not done, not indicated Extrem- cyanosis- none, clubbing, none, atrophy- none, strength- nl Neuro- grossly intact to observation    Assessment & Plan:

## 2016-07-27 ENCOUNTER — Telehealth: Payer: Self-pay

## 2016-07-27 ENCOUNTER — Ambulatory Visit: Payer: BLUE CROSS/BLUE SHIELD

## 2016-07-27 VITALS — BP 138/100

## 2016-07-27 DIAGNOSIS — I10 Essential (primary) hypertension: Secondary | ICD-10-CM

## 2016-07-27 MED ORDER — LISINOPRIL-HYDROCHLOROTHIAZIDE 20-12.5 MG PO TABS: 1.0000 | ORAL_TABLET | Freq: Every day | ORAL | 0 refills | Status: DC

## 2016-07-27 NOTE — Telephone Encounter (Signed)
Please inform patient that his blood pressure remains high so I have sent in an additional medication for him to start. Recommend low sodium diet and continue to monitor blood pressure at home. Follow up in 3 weeks.

## 2016-07-27 NOTE — Telephone Encounter (Signed)
Patient has been on bp med for about 2 or 2 1/2 weeks now, came in today, his readings at home have been a little high and reading today (nurse visit) is 138/100---routing to greg, please advise if you want to make bp med adjustments, I will call patient back, thanks

## 2016-07-28 NOTE — Telephone Encounter (Signed)
Patient advised of gregs note/instructions, he will add med to current med and come back in 3 weeks to have bp rechecked

## 2016-08-03 DIAGNOSIS — G4733 Obstructive sleep apnea (adult) (pediatric): Secondary | ICD-10-CM | POA: Diagnosis not present

## 2016-08-19 ENCOUNTER — Other Ambulatory Visit: Payer: Self-pay | Admitting: Orthopedic Surgery

## 2016-08-19 ENCOUNTER — Ambulatory Visit
Admission: RE | Admit: 2016-08-19 | Discharge: 2016-08-19 | Disposition: A | Discharge status: Home / Self Care | Payer: BLUE CROSS/BLUE SHIELD | Source: Ambulatory Visit | Attending: Orthopedic Surgery | Admitting: Orthopedic Surgery

## 2016-08-19 DIAGNOSIS — S82432A Displaced oblique fracture of shaft of left fibula, initial encounter for closed fracture: Secondary | ICD-10-CM | POA: Diagnosis not present

## 2016-08-19 DIAGNOSIS — S93601A Unspecified sprain of right foot, initial encounter: Secondary | ICD-10-CM | POA: Diagnosis not present

## 2016-08-19 DIAGNOSIS — S8262XA Displaced fracture of lateral malleolus of left fibula, initial encounter for closed fracture: Secondary | ICD-10-CM | POA: Diagnosis not present

## 2016-08-19 DIAGNOSIS — S82872A Displaced pilon fracture of left tibia, initial encounter for closed fracture: Secondary | ICD-10-CM | POA: Diagnosis not present

## 2016-08-19 DIAGNOSIS — S82202A Unspecified fracture of shaft of left tibia, initial encounter for closed fracture: Secondary | ICD-10-CM | POA: Diagnosis not present

## 2016-08-19 DIAGNOSIS — S82302A Unspecified fracture of lower end of left tibia, initial encounter for closed fracture: Secondary | ICD-10-CM | POA: Diagnosis not present

## 2016-08-25 DIAGNOSIS — S82392A Other fracture of lower end of left tibia, initial encounter for closed fracture: Secondary | ICD-10-CM | POA: Diagnosis not present

## 2016-08-25 DIAGNOSIS — S82872A Displaced pilon fracture of left tibia, initial encounter for closed fracture: Secondary | ICD-10-CM | POA: Diagnosis not present

## 2016-08-25 DIAGNOSIS — S82492A Other fracture of shaft of left fibula, initial encounter for closed fracture: Secondary | ICD-10-CM | POA: Diagnosis not present

## 2016-08-25 DIAGNOSIS — S8262XA Displaced fracture of lateral malleolus of left fibula, initial encounter for closed fracture: Secondary | ICD-10-CM | POA: Diagnosis not present

## 2016-08-25 DIAGNOSIS — Y999 Unspecified external cause status: Secondary | ICD-10-CM | POA: Diagnosis not present

## 2016-09-09 ENCOUNTER — Ambulatory Visit: Payer: BLUE CROSS/BLUE SHIELD

## 2016-09-09 ENCOUNTER — Telehealth: Payer: Self-pay

## 2016-09-09 VITALS — BP 138/88

## 2016-09-09 DIAGNOSIS — I10 Essential (primary) hypertension: Secondary | ICD-10-CM

## 2016-09-09 DIAGNOSIS — S8262XD Displaced fracture of lateral malleolus of left fibula, subsequent encounter for closed fracture with routine healing: Secondary | ICD-10-CM | POA: Diagnosis not present

## 2016-09-09 MED ORDER — LISINOPRIL-HYDROCHLOROTHIAZIDE 20-12.5 MG PO TABS: 1.0000 | ORAL_TABLET | Freq: Every day | ORAL | 0 refills | Status: DC

## 2016-09-09 NOTE — Telephone Encounter (Signed)
Routing to greg---patient came in for bp recheck today after starting lisinopril/hctz----he is a little late coming back because he broke his foot---today's bp reading was 138/88---I have also sent rx refill per patient request---please advise if you are ok with reading and whether you want to make any further changes to meds, thanks

## 2016-09-10 NOTE — Telephone Encounter (Signed)
Ok to continue with current regimen of amlodipine and lisinopril-hydrochlorothiazide. Ok to refill if needed.

## 2016-09-11 NOTE — Telephone Encounter (Signed)
Patient advised, rx refill was sent yesterday

## 2016-10-02 ENCOUNTER — Other Ambulatory Visit: Payer: Self-pay | Admitting: Family

## 2016-10-07 DIAGNOSIS — S8262XD Displaced fracture of lateral malleolus of left fibula, subsequent encounter for closed fracture with routine healing: Secondary | ICD-10-CM | POA: Diagnosis not present

## 2016-10-12 ENCOUNTER — Other Ambulatory Visit: Payer: Self-pay | Admitting: *Deleted

## 2016-10-12 MED ORDER — LISINOPRIL-HYDROCHLOROTHIAZIDE 20-12.5 MG PO TABS: 1.0000 | ORAL_TABLET | Freq: Every day | ORAL | 1 refills | Status: DC

## 2016-10-13 DIAGNOSIS — M25572 Pain in left ankle and joints of left foot: Secondary | ICD-10-CM | POA: Diagnosis not present

## 2016-10-15 DIAGNOSIS — M25572 Pain in left ankle and joints of left foot: Secondary | ICD-10-CM | POA: Diagnosis not present

## 2016-10-20 DIAGNOSIS — M25572 Pain in left ankle and joints of left foot: Secondary | ICD-10-CM | POA: Diagnosis not present

## 2016-10-22 DIAGNOSIS — M25572 Pain in left ankle and joints of left foot: Secondary | ICD-10-CM | POA: Diagnosis not present

## 2016-10-27 DIAGNOSIS — M25572 Pain in left ankle and joints of left foot: Secondary | ICD-10-CM | POA: Diagnosis not present

## 2016-10-30 DIAGNOSIS — M25572 Pain in left ankle and joints of left foot: Secondary | ICD-10-CM | POA: Diagnosis not present

## 2016-11-03 DIAGNOSIS — M25572 Pain in left ankle and joints of left foot: Secondary | ICD-10-CM | POA: Diagnosis not present

## 2016-11-05 DIAGNOSIS — M25572 Pain in left ankle and joints of left foot: Secondary | ICD-10-CM | POA: Diagnosis not present

## 2016-11-11 DIAGNOSIS — S8262XD Displaced fracture of lateral malleolus of left fibula, subsequent encounter for closed fracture with routine healing: Secondary | ICD-10-CM | POA: Diagnosis not present

## 2016-11-17 DIAGNOSIS — M25572 Pain in left ankle and joints of left foot: Secondary | ICD-10-CM | POA: Diagnosis not present

## 2016-11-19 DIAGNOSIS — M25572 Pain in left ankle and joints of left foot: Secondary | ICD-10-CM | POA: Diagnosis not present

## 2016-11-26 DIAGNOSIS — M25572 Pain in left ankle and joints of left foot: Secondary | ICD-10-CM | POA: Diagnosis not present

## 2016-12-01 DIAGNOSIS — M25572 Pain in left ankle and joints of left foot: Secondary | ICD-10-CM | POA: Diagnosis not present

## 2016-12-03 DIAGNOSIS — M25572 Pain in left ankle and joints of left foot: Secondary | ICD-10-CM | POA: Diagnosis not present

## 2016-12-08 DIAGNOSIS — M25572 Pain in left ankle and joints of left foot: Secondary | ICD-10-CM | POA: Diagnosis not present

## 2016-12-10 DIAGNOSIS — M25572 Pain in left ankle and joints of left foot: Secondary | ICD-10-CM | POA: Diagnosis not present

## 2016-12-28 ENCOUNTER — Other Ambulatory Visit: Payer: Self-pay | Admitting: Family

## 2017-01-11 DIAGNOSIS — S8262XD Displaced fracture of lateral malleolus of left fibula, subsequent encounter for closed fracture with routine healing: Secondary | ICD-10-CM | POA: Diagnosis not present

## 2017-04-09 ENCOUNTER — Other Ambulatory Visit: Payer: Self-pay | Admitting: *Deleted

## 2017-05-03 ENCOUNTER — Other Ambulatory Visit: Payer: Self-pay | Admitting: Family

## 2017-05-04 ENCOUNTER — Encounter: Payer: Self-pay | Admitting: Family

## 2017-05-07 DIAGNOSIS — G4733 Obstructive sleep apnea (adult) (pediatric): Secondary | ICD-10-CM | POA: Diagnosis not present

## 2017-05-12 ENCOUNTER — Encounter: Payer: Self-pay | Admitting: Internal Medicine

## 2017-05-12 ENCOUNTER — Other Ambulatory Visit (INDEPENDENT_AMBULATORY_CARE_PROVIDER_SITE_OTHER): Payer: BLUE CROSS/BLUE SHIELD

## 2017-05-12 ENCOUNTER — Ambulatory Visit: Payer: BLUE CROSS/BLUE SHIELD | Admitting: Internal Medicine

## 2017-05-12 VITALS — BP 120/60 | HR 63 | Temp 98.3°F | Resp 16 | Ht 68.0 in | Wt 268.0 lb

## 2017-05-12 DIAGNOSIS — I10 Essential (primary) hypertension: Secondary | ICD-10-CM

## 2017-05-12 DIAGNOSIS — Z0001 Encounter for general adult medical examination with abnormal findings: Secondary | ICD-10-CM

## 2017-05-12 LAB — URINALYSIS, ROUTINE W REFLEX MICROSCOPIC
BILIRUBIN URINE: NEGATIVE
Hgb urine dipstick: NEGATIVE
KETONES UR: NEGATIVE
LEUKOCYTES UA: NEGATIVE
Nitrite: NEGATIVE
PH: 7 (ref 5.0–8.0)
RBC / HPF: NONE SEEN (ref 0–?)
SPECIFIC GRAVITY, URINE: 1.02 (ref 1.000–1.030)
TOTAL PROTEIN, URINE-UPE24: NEGATIVE
UROBILINOGEN UA: 0.2 (ref 0.0–1.0)
Urine Glucose: NEGATIVE

## 2017-05-12 LAB — CBC WITH DIFFERENTIAL/PLATELET
BASOS PCT: 0.4 % (ref 0.0–3.0)
Basophils Absolute: 0 10*3/uL (ref 0.0–0.1)
Eosinophils Absolute: 0.2 10*3/uL (ref 0.0–0.7)
Eosinophils Relative: 2 % (ref 0.0–5.0)
HEMATOCRIT: 47.1 % (ref 39.0–52.0)
Hemoglobin: 16.1 g/dL (ref 13.0–17.0)
LYMPHS ABS: 1.9 10*3/uL (ref 0.7–4.0)
LYMPHS PCT: 21 % (ref 12.0–46.0)
MCHC: 34.2 g/dL (ref 30.0–36.0)
MCV: 84.5 fl (ref 78.0–100.0)
MONOS PCT: 7.1 % (ref 3.0–12.0)
Monocytes Absolute: 0.6 10*3/uL (ref 0.1–1.0)
NEUTROS ABS: 6.3 10*3/uL (ref 1.4–7.7)
NEUTROS PCT: 69.5 % (ref 43.0–77.0)
PLATELETS: 293 10*3/uL (ref 150.0–400.0)
RBC: 5.57 Mil/uL (ref 4.22–5.81)
RDW: 13.6 % (ref 11.5–15.5)
WBC: 9.1 10*3/uL (ref 4.0–10.5)

## 2017-05-12 LAB — LDL CHOLESTEROL, DIRECT: LDL DIRECT: 98 mg/dL

## 2017-05-12 LAB — LIPID PANEL
CHOL/HDL RATIO: 4
CHOLESTEROL: 153 mg/dL (ref 0–200)
HDL: 36.3 mg/dL — ABNORMAL LOW (ref >39.00)
NonHDL: 116.67
TRIGLYCERIDES: 235 mg/dL — AB (ref 0.0–149.0)
VLDL: 47 mg/dL — ABNORMAL HIGH (ref 0.0–40.0)

## 2017-05-12 LAB — COMPREHENSIVE METABOLIC PANEL
ALBUMIN: 4.3 g/dL (ref 3.5–5.2)
ALT: 35 U/L (ref 0–53)
AST: 20 U/L (ref 0–37)
Alkaline Phosphatase: 76 U/L (ref 39–117)
BILIRUBIN TOTAL: 0.6 mg/dL (ref 0.2–1.2)
BUN: 17 mg/dL (ref 6–23)
CALCIUM: 9.7 mg/dL (ref 8.4–10.5)
CO2: 30 meq/L (ref 19–32)
CREATININE: 1.18 mg/dL (ref 0.40–1.50)
Chloride: 105 mEq/L (ref 96–112)
GFR: 68.71 mL/min (ref >60.00)
Glucose, Bld: 116 mg/dL — ABNORMAL HIGH (ref 70–99)
Potassium: 4.2 mEq/L (ref 3.5–5.1)
Sodium: 143 mEq/L (ref 135–145)
Total Protein: 6.3 g/dL (ref 6.0–8.3)

## 2017-05-12 LAB — PSA: PSA: 3.99 ng/mL (ref 0.10–4.00)

## 2017-05-12 MED ORDER — AMLODIPINE BESYLATE 10 MG PO TABS: 10.0000 mg | ORAL_TABLET | Freq: Every day | ORAL | 1 refills | Status: DC

## 2017-05-12 MED ORDER — LISINOPRIL-HYDROCHLOROTHIAZIDE 20-12.5 MG PO TABS: 1.0000 | ORAL_TABLET | Freq: Every day | ORAL | 1 refills | Status: DC

## 2017-05-12 NOTE — Patient Instructions (Signed)

## 2017-05-12 NOTE — Progress Notes (Signed)
Subjective:  Patient ID: Larry Hutchinson, male    DOB: Oct 11, 1965  Age: 51 y.o. MRN: [id number]  CC: Hypertension; Hyperlipidemia; and Annual Exam  NEW TO ME.  HPI Larry Hutchinson presents for a CPX.  He tells me his blood pressure has been well controlled.  He has had no recent episodes of CP, DOE, palpitations, edema, or fatigue.  Outpatient Medications Prior to Visit  Medication Sig Dispense Refill  . aspirin 325 MG tablet Take 650 mg by mouth once.    [name] ibuprofen (ADVIL,MOTRIN) 200 MG tablet Take 200 mg by mouth every 6 (six) hours as needed.    [name] amLODipine (NORVASC) 10 MG tablet TAKE 1 TABLET BY MOUTH EVERY DAY 30 tablet 2  . lisinopril-hydrochlorothiazide (PRINZIDE,ZESTORETIC) 20-12.5 MG tablet Take 1 tablet by mouth daily. Yearly physical w/labs due in Oct will need appt for refills 90 tablet 1   No facility-administered medications prior to visit.     ROS Review of Systems  Constitutional: Negative.  Negative for appetite change, diaphoresis, fatigue and unexpected weight change.  HENT: Negative.   Eyes: Negative for visual disturbance.  Respiratory: Positive for apnea. Negative for cough, chest tightness, shortness of breath and wheezing.   Cardiovascular: Negative.  Negative for chest pain, palpitations and leg swelling.  Gastrointestinal: Negative.  Negative for abdominal pain, blood in stool, constipation, diarrhea, nausea and vomiting.  Endocrine: Negative.   Genitourinary: Negative.  Negative for difficulty urinating, flank pain, frequency, penile pain, penile swelling, scrotal swelling and testicular pain.  Musculoskeletal: Negative.  Negative for back pain, myalgias and neck pain.  Skin: Negative.  Negative for color change and rash.  Allergic/Immunologic: Negative.   Neurological: Negative.  Negative for dizziness, weakness and headaches.  Hematological: Negative for adenopathy. Does not bruise/bleed easily.  Psychiatric/Behavioral: Negative.     Objective:    BP 120/60 (BP Location: Left Arm, Patient Position: Sitting, Cuff Size: Normal)   Pulse 63   Temp 98.3 F (36.8 C) (Oral)   Resp 16   Ht 5\' 8"  (1.727 m)   Wt 268 lb (121.6 kg)   SpO2 96%   BMI 40.75 kg/m   BP Readings from Last 3 Encounters:  05/12/17 120/60  09/09/16 138/88  07/27/16 (!) 138/100    Wt Readings from Last 3 Encounters:  05/12/17 268 lb (121.6 kg)  07/08/16 263 lb (119.3 kg)  07/08/16 263 lb (119.3 kg)    Physical Exam  Constitutional: He is oriented to person, place, and time. No distress.  HENT:  Mouth/Throat: Oropharynx is clear and moist. No oropharyngeal exudate.  Eyes: Conjunctivae are normal. Right eye exhibits no discharge. Left eye exhibits no discharge. No scleral icterus.  Neck: Normal range of motion. Neck supple. No JVD present. No thyromegaly present.  Cardiovascular: Normal rate, regular rhythm, normal heart sounds and intact distal pulses.  EKG -  Sinus  Rhythm  WITHIN NORMAL LIMITS- no change from the prior EKG  Pulmonary/Chest: Effort normal and breath sounds normal. No respiratory distress. He has no wheezes. He has no rales.  Abdominal: Soft. Bowel sounds are normal. He exhibits no distension and no mass. There is no tenderness. There is no rebound and no guarding. Hernia confirmed negative in the right inguinal area and confirmed negative in the left inguinal area.  Genitourinary: Rectum normal, prostate normal, testes normal and penis normal. Rectal exam shows no external hemorrhoid, no internal hemorrhoid, no fissure, no mass, no tenderness, anal tone normal and guaiac negative stool. Prostate is not enlarged  and not tender. Right testis shows no mass, no swelling and no tenderness. Right testis is descended. Left testis shows no mass, no swelling and no tenderness. Left testis is descended. Circumcised. No penile erythema or penile tenderness. No discharge found.  Musculoskeletal: Normal range of motion. He exhibits no edema, tenderness or  deformity.  Lymphadenopathy:    He has no cervical adenopathy.       Right: No inguinal adenopathy present.       Left: No inguinal adenopathy present.  Neurological: He is alert and oriented to person, place, and time.  Skin: Skin is warm and dry. No rash noted. He is not diaphoretic. No erythema. No pallor.  Psychiatric: He has a normal mood and affect. His behavior is normal. Thought content normal.  Vitals reviewed.   Lab Results  Component Value Date   WBC 9.1 05/12/2017   HGB 16.1 05/12/2017   HCT 47.1 05/12/2017   PLT 293.0 05/12/2017   GLUCOSE 116 (H) 05/12/2017   CHOL 153 05/12/2017   TRIG 235.0 (H) 05/12/2017   HDL 36.30 (L) 05/12/2017   LDLDIRECT 98.0 05/12/2017   LDLCALC 99 03/19/2016   ALT 35 05/12/2017   AST 20 05/12/2017   NA 143 05/12/2017   K 4.2 05/12/2017   CL 105 05/12/2017   CREATININE 1.18 05/12/2017   BUN 17 05/12/2017   CO2 30 05/12/2017   TSH 0.87 05/12/2017   PSA 3.99 05/12/2017   INR 1.0 01/11/2008   HGBA1C 5.5 04/10/2014    Ct Ankle Left Wo Contrast  Result Date: 08/19/2016 CLINICAL DATA:  Close displaced lateral malleolar fracture. EXAM: CT OF THE LEFT ANKLE WITHOUT CONTRAST TECHNIQUE: Multidetector CT imaging of the left ankle was performed according to the standard protocol. Multiplanar CT image reconstructions were also generated. COMPARISON:  None. FINDINGS: Bones/Joint/Cartilage Oblique mildly comminuted fracture of the left distal fibular diaphysis with 6 mm of posterior displacement. Mildly comminuted, nondisplaced posterior malleolar fracture of the distal tibia involving the articular surface of the tibial plafond with 1 mm of step-off. Horizontal fracture cleft that involves the posterior malleolus extends medially through the remainder of the articular surface of the tibial plafond and into the medial malleolus. No other fracture or dislocation. Ankle mortise is intact. Subtalar joints are normal. Nondisplaced fracture of the medial  malleolus No joint effusion. Ligaments Ligaments are suboptimally evaluated by CT. Muscles and Tendons The muscles are normal. Flexor, extensor, peroneal and Achilles tendons are grossly intact. Soft tissue No fluid collection or hematoma. No soft tissue mass. Severe soft tissue edema along the lateral aspect of the ankle. Mild soft tissue edema along the medial aspect of the ankle. IMPRESSION: 1. Oblique mildly comminuted fracture of the left distal fibular diaphysis with 6 mm of posterior displacement. 2. Mildly comminuted, nondisplaced posterior malleolar fracture of the distal tibia involving the articular surface of the tibial plafond with 1 mm of step-off. Horizontal fracture cleft that involves the posterior malleolus extends medially through the remainder of the articular surface of the tibial plafond and into the medial malleolus. Electronically Signed   By: Kathreen Devoid   On: 08/19/2016 13:20    Assessment & Plan:   Yamen was seen today for hypertension, hyperlipidemia and annual exam.  Diagnoses and all orders for this visit:  Essential hypertension- His blood pressure is well controlled.  Electrolytes and renal function are normal.  His EKG is negative for LVH.  Will continue the combination of an ACE inhibitor, thiazide diuretic, and calcium channel  blocker. -     Comprehensive metabolic panel; Future -     CBC with Differential/Platelet; Future -     Thyroid Panel With TSH; Future -     Urinalysis, Routine w reflex microscopic; Future -     EKG 12-Lead -     lisinopril-hydrochlorothiazide (PRINZIDE,ZESTORETIC) 20-12.5 MG tablet; Take 1 tablet by mouth daily. -     amLODipine (NORVASC) 10 MG tablet; Take 1 tablet (10 mg total) by mouth daily.  Encounter for general adult medical examination with abnormal findings- Exam completed, labs reviewed- he has an elevated PSA but this has been stable over the last year, I will continue to follow his PSA closely and if it starts to rise then  will refer to urology to consider biopsy.  He refused a flu vaccine today.  Screening for colon cancer is up-to-date.  Patient education material was given. -     Lipid panel; Future -     PSA; Future -     HIV antibody; Future   I have changed Macon Koplin's lisinopril-hydrochlorothiazide and amLODipine. I am also having him maintain his ibuprofen and aspirin.  Meds ordered this encounter  Medications  . lisinopril-hydrochlorothiazide (PRINZIDE,ZESTORETIC) 20-12.5 MG tablet    Sig: Take 1 tablet by mouth daily.    Dispense:  90 tablet    Refill:  1  . amLODipine (NORVASC) 10 MG tablet    Sig: Take 1 tablet (10 mg total) by mouth daily.    Dispense:  90 tablet    Refill:  1     Follow-up: Return in about 6 months (around 11/10/2017).  Scarlette Calico, MD

## 2017-05-13 ENCOUNTER — Encounter: Payer: Self-pay | Admitting: Internal Medicine

## 2017-05-13 LAB — THYROID PANEL WITH TSH
FREE THYROXINE INDEX: 2.4 (ref 1.4–3.8)
T3 Uptake: 28 % (ref 22–35)
T4 TOTAL: 8.6 ug/dL (ref 4.9–10.5)
TSH: 0.87 mIU/L (ref 0.40–4.50)

## 2017-05-13 LAB — HIV ANTIBODY (ROUTINE TESTING W REFLEX): HIV 1&2 Ab, 4th Generation: NONREACTIVE

## 2017-07-08 ENCOUNTER — Ambulatory Visit: Payer: BLUE CROSS/BLUE SHIELD | Admitting: Internal Medicine

## 2017-07-08 ENCOUNTER — Encounter: Payer: Self-pay | Admitting: Internal Medicine

## 2017-07-08 VITALS — BP 114/68 | HR 75 | Ht 68.0 in | Wt 269.4 lb

## 2017-07-08 DIAGNOSIS — I10 Essential (primary) hypertension: Secondary | ICD-10-CM | POA: Diagnosis not present

## 2017-07-08 DIAGNOSIS — G4733 Obstructive sleep apnea (adult) (pediatric): Secondary | ICD-10-CM

## 2017-07-08 NOTE — Progress Notes (Signed)
Subjective:    Patient ID: Larry Hutchinson, male    DOB: April 05, 1966, 52 y.o.   MRN: [id number]  HPI  male former smoker followed for OSA, complicated by GERD/esophageal stricture, HBP NPSG 07/08/14 AHI  75/ hr, desat to 73%, Weight 250 lbs  ------------------------------------------------------------------------------  07/08/2016-52 year old male former smoker followed for OSA, complicated by GERD/esophageal stricture, HBP, obesity 172/118 on arrival with HA   263 lbs "I don't feel right" FOLLOWS FOR: Wears CPAP nightly. Denies problems with mask/pressure. DME: [facility]. He feels he has done extremely well with his VPAP at current settings. Couldn't wear it for a couple of nights 2 weeks ago when he had a bad bronchitis but much better now. Since then has been waking in the mornings with "burning" feeling in his chest as if he been running hard but little residual cough. Sleeping well with no reports of snoring.  07/08/17-52 year old male former smoker followed for OSA, complicated by GERD/esophageal stricture, HBP, obesity BIPAP VPAP: IPAP max 17, EPAP mean 13, PS4/ [facility]> today I 16, E 13, PS 4 ----OSA; DME: [facility]. Pt wears CPAP nightly and DL attached. No new supplies and pressure works okay.  Occasional dry pressure discomfort and throat.  Not sure how this relates to his Pap but we can try reducing inspiratory pressure a little. Download 100% compliance, AHI 2.3/hour  ROS-see HPI   + = pos Constitutional:    weight loss, night sweats, fevers, chills, fatigue, lassitude. + malaise" HEENT:    +headaches, difficulty swallowing, tooth/dental problems, sore throat,       sneezing, itching, ear ache, nasal congestion, post nasal drip, snoring CV:    chest pain, orthopnea, PND, swelling in lower extremities, anasarca,                                                  dizziness, palpitations Resp:   shortness of breath with exertion or at rest.                productive cough,  +  non-productive cough, coughing up of blood.              change in color of mucus.  wheezing.   Skin:    rash or lesions. GI:  No-   heartburn, indigestion, abdominal pain, nausea, vomiting, diarrhea,                 change in bowel habits, loss of appetite GU: dysuria, change in color of urine, no urgency or frequency.   flank pain. MS:   joint pain, stiffness, decreased range of motion, back pain. Neuro-     nothing unusual Psych:  change in mood or affect.  depression or anxiety.   memory loss.    Objective:  OBJ- Physical Exam General- Alert, Oriented, Affect-appropriate, Distress- none acute, +Obese     Skin- rash-none, lesions- none, excoriation- none Lymphadenopathy- none Head- atraumatic            Eyes- Gross vision intact, PERRLA, conjunctivae and secretions clear            Ears- Hearing, canals-normal            Nose- Clear, no-Septal dev, mucus, polyps, erosion, perforation             Throat- Mallampati IV , mucosa clear , drainage- none, tonsils- atrophic Neck-  flexible , trachea midline, no stridor , thyroid nl, carotid no bruit Chest - symmetrical excursion , unlabored           Heart/CV- RRR , no murmur , no gallop  , no rub, nl s1 s2                           - JVD- none , edema- none, stasis changes- none, varices- none           Lung- clear to P&A, wheeze- none, cough- none, dullness-none, rub- none           Chest wall-  Abd-  Br/ Gen/ Rectal- Not done, not indicated Extrem- cyanosis- none, clubbing, none, atrophy- none, strength- nl Neuro- grossly intact to observation    Assessment & Plan:

## 2017-07-08 NOTE — Patient Instructions (Signed)
Order- DME [facility]  Please change VPAP auto to Max Insp 16,  Min Exp 13, PS 4  Your owner's manual will have directions for adjusting the humidifier up a little now, while the air is dry. That may make you more comfortable.  Please call if we can help

## 2017-07-09 NOTE — Assessment & Plan Note (Signed)
Last visit we had sent him down to primary care because of significant hypertension.  At this visit pressure is well controlled.

## 2017-07-09 NOTE — Assessment & Plan Note (Signed)
He still needs to make a real effort to lose some weight-discussed.

## 2017-07-09 NOTE — Assessment & Plan Note (Signed)
He recognizes better sleep with CPAP.  Download confirms good compliance and control.  Not sure if the dry pressure sensation he gets in his throat is related to airflow. Plan-try reducing VPAP to max Insp 16, Min Exp 13.

## 2017-08-24 DIAGNOSIS — G4733 Obstructive sleep apnea (adult) (pediatric): Secondary | ICD-10-CM | POA: Diagnosis not present

## 2017-11-08 ENCOUNTER — Other Ambulatory Visit: Payer: Self-pay | Admitting: Internal Medicine

## 2017-11-08 DIAGNOSIS — I10 Essential (primary) hypertension: Secondary | ICD-10-CM

## 2018-02-03 ENCOUNTER — Other Ambulatory Visit: Payer: Self-pay | Admitting: Internal Medicine

## 2018-02-03 DIAGNOSIS — I10 Essential (primary) hypertension: Secondary | ICD-10-CM

## 2018-05-19 DIAGNOSIS — G4733 Obstructive sleep apnea (adult) (pediatric): Secondary | ICD-10-CM | POA: Diagnosis not present

## 2018-06-25 ENCOUNTER — Telehealth: Payer: Self-pay | Admitting: Internal Medicine

## 2018-06-25 DIAGNOSIS — I10 Essential (primary) hypertension: Secondary | ICD-10-CM

## 2018-06-27 NOTE — Telephone Encounter (Signed)
Not able to send any medication is has been over 12 months since we saw patient.

## 2018-06-27 NOTE — Telephone Encounter (Signed)
Pt would like to see if enough medication can be sent in to last until his 07/19/2018 appt that he scheduled?

## 2018-06-28 ENCOUNTER — Encounter: Payer: Self-pay | Admitting: Internal Medicine

## 2018-06-28 NOTE — Telephone Encounter (Signed)
Pt informed of same.  

## 2018-07-11 ENCOUNTER — Ambulatory Visit: Payer: BLUE CROSS/BLUE SHIELD | Admitting: Internal Medicine

## 2018-07-11 ENCOUNTER — Encounter: Payer: Self-pay | Admitting: Internal Medicine

## 2018-07-11 DIAGNOSIS — I10 Essential (primary) hypertension: Secondary | ICD-10-CM | POA: Diagnosis not present

## 2018-07-11 DIAGNOSIS — G4733 Obstructive sleep apnea (adult) (pediatric): Secondary | ICD-10-CM

## 2018-07-11 NOTE — Assessment & Plan Note (Signed)
BP has been under better control.  He lets himself run out of lisinopril occasionally-discussed with PCP.

## 2018-07-11 NOTE — Assessment & Plan Note (Signed)
He is going to consider whether he wants a pressure change and I invited him to call us.  We can easily change him to I 15, E 13 if he wishes.

## 2018-07-11 NOTE — Assessment & Plan Note (Signed)
He remains too heavy and this impacts his overall health as discussed.  Weight loss is encouraged.

## 2018-07-11 NOTE — Progress Notes (Signed)
Subjective:    Patient ID: Larry Hutchinson, male    DOB: November 08, 1965, 53 y.o.   MRN: [id number]  HPI  male former smoker followed for OSA, complicated by GERD/esophageal stricture, HBP NPSG 07/08/14 AHI  75/ hr, desat to 73%, Weight 250 lbs  ------------------------------------------------------------------------------ 07/08/17-53 year old male former smoker followed for OSA, complicated by GERD/esophageal stricture, HBP, obesity BIPAP VPAP: IPAP max 17, EPAP mean 13, PS4/ [facility]> today I 16, E 13, PS 4 ----OSA; DME: [facility]. Pt wears CPAP nightly and DL attached. No new supplies and pressure works okay.  Occasional dry pressure discomfort and throat.  Not sure how this relates to his Pap but we can try reducing inspiratory pressure a little. Download 100% compliance, AHI 2.3/hour  07/11/2018- 53 year old male former smoker followed for OSA, complicated by GERD/esophageal stricture, HBP, obesity BIPAP VPAP: IPAP max 16, EPAP mean 13, PS4/ [facility] Body weight today 269 pounds -----Sleeping pretty well - no complaints with CPAP.   Download 100% compliance AHI 1.7/hour He says he "could not make it without my VPAP".  Occasionally the pressure seems a little too high, but better after we turned it down a step last year.  He is prepared to wait and watch before making another change. I asked him routinely about heart symptoms and he mentioned occasional feeling of fullness and palpitation behind upper sternum.  He is going to discuss this at upcoming appointment with his PCP.  ROS-see HPI   + = positive Constitutional:    weight loss, night sweats, fevers, chills, fatigue, lassitude.  HEENT:    +headaches, difficulty swallowing, tooth/dental problems, sore throat,       sneezing, itching, ear ache, nasal congestion, post nasal drip, snoring CV:    chest pain, orthopnea, PND, swelling in lower extremities, anasarca,                                                  dizziness, palpitations Resp:    shortness of breath with exertion or at rest.                productive cough,  + non-productive cough, coughing up of blood.              change in color of mucus.  wheezing.   Skin:    rash or lesions. GI:  No-   heartburn, indigestion, abdominal pain, nausea, vomiting, diarrhea,                 change in bowel habits, loss of appetite GU: dysuria, change in color of urine, no urgency or frequency.   flank pain. MS:   joint pain, stiffness, decreased range of motion, back pain. Neuro-     nothing unusual Psych:  change in mood or affect.  depression or anxiety.   memory loss.    Objective:  OBJ- Physical Exam General- Alert, Oriented, Affect-appropriate, Distress- none acute, +Obese     Skin- rash-none, lesions- none, excoriation- none Lymphadenopathy- none Head- atraumatic            Eyes- Gross vision intact, PERRLA, conjunctivae and secretions clear            Ears- Hearing, canals-normal            Nose- Clear, no-Septal dev, mucus, polyps, erosion, perforation  Throat- Mallampati IV , mucosa clear , drainage- none, tonsils- atrophic Neck- flexible , trachea midline, no stridor , thyroid nl, carotid no bruit Chest - symmetrical excursion , unlabored           Heart/CV- RRR today, no murmur , no gallop  , no rub, nl s1 s2                           - JVD- none , edema- none, stasis changes- none, varices- none           Lung- clear to P&A, wheeze- none, cough- none, dullness-none, rub- none           Chest wall-  Abd-  Br/ Gen/ Rectal- Not done, not indicated Extrem- cyanosis- none, clubbing, none, atrophy- none, strength- nl Neuro- grossly intact to observation    Assessment & Plan:

## 2018-07-11 NOTE — Patient Instructions (Signed)
We can continue VPAP I 16, E 13, PS 4, mask of choice, humidifier, supplies, AirView  Please call if we can help

## 2018-07-19 ENCOUNTER — Telehealth: Payer: Self-pay | Admitting: Internal Medicine

## 2018-07-19 ENCOUNTER — Encounter: Payer: Self-pay | Admitting: Internal Medicine

## 2018-07-19 ENCOUNTER — Ambulatory Visit: Payer: BLUE CROSS/BLUE SHIELD | Admitting: Internal Medicine

## 2018-07-19 ENCOUNTER — Other Ambulatory Visit (INDEPENDENT_AMBULATORY_CARE_PROVIDER_SITE_OTHER): Payer: BLUE CROSS/BLUE SHIELD

## 2018-07-19 VITALS — BP 152/96 | HR 61 | Temp 98.4°F | Resp 16 | Ht 68.0 in | Wt 272.0 lb

## 2018-07-19 DIAGNOSIS — R972 Elevated prostate specific antigen [PSA]: Secondary | ICD-10-CM

## 2018-07-19 DIAGNOSIS — Z125 Encounter for screening for malignant neoplasm of prostate: Secondary | ICD-10-CM

## 2018-07-19 DIAGNOSIS — I10 Essential (primary) hypertension: Secondary | ICD-10-CM | POA: Diagnosis not present

## 2018-07-19 DIAGNOSIS — E559 Vitamin D deficiency, unspecified: Secondary | ICD-10-CM | POA: Diagnosis not present

## 2018-07-19 DIAGNOSIS — Z0001 Encounter for general adult medical examination with abnormal findings: Secondary | ICD-10-CM | POA: Diagnosis not present

## 2018-07-19 LAB — CBC WITH DIFFERENTIAL/PLATELET
Basophils Absolute: 0 10*3/uL (ref 0.0–0.1)
Basophils Relative: 0.3 % (ref 0.0–3.0)
Eosinophils Absolute: 0.2 10*3/uL (ref 0.0–0.7)
Eosinophils Relative: 2.2 % (ref 0.0–5.0)
HCT: 47.1 % (ref 39.0–52.0)
Hemoglobin: 16.3 g/dL (ref 13.0–17.0)
Lymphocytes Relative: 26.5 % (ref 12.0–46.0)
Lymphs Abs: 2.8 10*3/uL (ref 0.7–4.0)
MCHC: 34.6 g/dL (ref 30.0–36.0)
MCV: 83 fl (ref 78.0–100.0)
Monocytes Absolute: 0.8 10*3/uL (ref 0.1–1.0)
Monocytes Relative: 7.8 % (ref 3.0–12.0)
NEUTROS ABS: 6.7 10*3/uL (ref 1.4–7.7)
Neutrophils Relative %: 63.2 % (ref 43.0–77.0)
Platelets: 287 10*3/uL (ref 150.0–400.0)
RBC: 5.67 Mil/uL (ref 4.22–5.81)
RDW: 14.2 % (ref 11.5–15.5)
WBC: 10.7 10*3/uL — ABNORMAL HIGH (ref 4.0–10.5)

## 2018-07-19 LAB — LIPID PANEL
Cholesterol: 174 mg/dL (ref 0–200)
HDL: 35.8 mg/dL — ABNORMAL LOW (ref >39.00)
LDL Cholesterol: 102 mg/dL — ABNORMAL HIGH (ref 0–99)
NONHDL: 138
Total CHOL/HDL Ratio: 5
Triglycerides: 182 mg/dL — ABNORMAL HIGH (ref 0.0–149.0)
VLDL: 36.4 mg/dL (ref 0.0–40.0)

## 2018-07-19 LAB — COMPREHENSIVE METABOLIC PANEL
ALT: 32 U/L (ref 0–53)
AST: 20 U/L (ref 0–37)
Albumin: 4.2 g/dL (ref 3.5–5.2)
Alkaline Phosphatase: 87 U/L (ref 39–117)
BUN: 16 mg/dL (ref 6–23)
CO2: 29 mEq/L (ref 19–32)
Calcium: 9.4 mg/dL (ref 8.4–10.5)
Chloride: 104 mEq/L (ref 96–112)
Creatinine, Ser: 1.18 mg/dL (ref 0.40–1.50)
GFR: 64.36 mL/min (ref >60.00)
Glucose, Bld: 92 mg/dL (ref 70–99)
Potassium: 4.2 mEq/L (ref 3.5–5.1)
SODIUM: 140 meq/L (ref 135–145)
Total Bilirubin: 0.5 mg/dL (ref 0.2–1.2)
Total Protein: 6.3 g/dL (ref 6.0–8.3)

## 2018-07-19 LAB — URINALYSIS, ROUTINE W REFLEX MICROSCOPIC
BILIRUBIN URINE: NEGATIVE
HGB URINE DIPSTICK: NEGATIVE
KETONES UR: NEGATIVE
LEUKOCYTE UA: NEGATIVE
Nitrite: NEGATIVE
PH: 6.5 (ref 5.0–8.0)
RBC / HPF: NONE SEEN (ref 0–?)
Specific Gravity, Urine: <=1.005 — AB (ref 1.000–1.030)
Total Protein, Urine: NEGATIVE
UROBILINOGEN UA: 0.2 (ref 0.0–1.0)
Urine Glucose: NEGATIVE
WBC, UA: NONE SEEN (ref 0–?)

## 2018-07-19 LAB — TSH: TSH: 1.38 u[IU]/mL (ref 0.35–4.50)

## 2018-07-19 LAB — HEMOGLOBIN A1C: Hgb A1c MFr Bld: 5.3 % (ref 4.6–6.5)

## 2018-07-19 LAB — PSA: PSA: 5.31 ng/mL — ABNORMAL HIGH (ref 0.10–4.00)

## 2018-07-19 LAB — VITAMIN D 25 HYDROXY (VIT D DEFICIENCY, FRACTURES): VITD: 25.36 ng/mL — AB (ref 30.00–100.00)

## 2018-07-19 MED ORDER — CHOLECALCIFEROL 50 MCG (2000 UT) PO TABS: 1.0000 | ORAL_TABLET | Freq: Every day | ORAL | 1 refills | Status: DC

## 2018-07-19 MED ORDER — AZILSARTAN-CHLORTHALIDONE 40-12.5 MG PO TABS: 1.0000 | ORAL_TABLET | Freq: Every day | ORAL | 0 refills | Status: DC

## 2018-07-19 NOTE — Telephone Encounter (Signed)
Copied from [place] [id number]. Topic: Quick Communication - See Telephone Encounter >> Jul 19, 2018  5:19 PM Rutherford Nail, [place] wrote: CRM for notification. See Telephone encounter for: 07/19/18. Patient calling and states that his pharmacy does not have the Azilsartan-Chlorthalidone (EDARBYCLOR) 40-12.5 MG TABS  in stock. States that a prior authorization is also needed for his insurance to cover it. Please advise.

## 2018-07-19 NOTE — Patient Instructions (Signed)

## 2018-07-19 NOTE — Progress Notes (Signed)
Subjective:  Patient ID: Larry Hutchinson, male    DOB: 02/11/1966  Age: 53 y.o. MRN: [id number]  CC: Annual Exam and Hypertension   HPI Larry Hutchinson presents for a CPX.  His blood pressure has not been well controlled.  He ran out of lisinopril and hydrochlorothiazide about 3 weeks ago.  He has been taking amlodipine.  He complains of weight gain, intermittent headaches, and he experiences shortness of breath at rest.  He is active and works out at [employer] and he does not experience DOE or CP with exertion.  Outpatient Medications Prior to Visit  Medication Sig Dispense Refill  . aspirin EC 81 MG tablet Take 81 mg by mouth daily.    . cyanocobalamin 1000 MCG tablet Take 1,000 mcg by mouth daily.    [name] amLODipine (NORVASC) 10 MG tablet TAKE 1 TABLET BY MOUTH EVERY DAY 90 tablet 0  . ibuprofen (ADVIL,MOTRIN) 200 MG tablet Take 200 mg by mouth every 6 (six) hours as needed.    [name] lisinopril-hydrochlorothiazide (PRINZIDE,ZESTORETIC) 20-12.5 MG tablet TAKE 1 TABLET BY MOUTH EVERY DAY 90 tablet 0   No facility-administered medications prior to visit.     ROS Review of Systems  Constitutional: Positive for unexpected weight change (wt gain). Negative for appetite change, diaphoresis and fatigue.  Eyes: Negative for visual disturbance.  Respiratory: Positive for apnea and shortness of breath. Negative for cough, chest tightness and wheezing.   Cardiovascular: Negative for chest pain, palpitations and leg swelling.  Gastrointestinal: Negative for abdominal pain, constipation, diarrhea, nausea and vomiting.  Endocrine: Negative.   Genitourinary: Negative.  Negative for difficulty urinating, penile swelling, scrotal swelling, testicular pain and urgency.  Musculoskeletal: Negative.  Negative for arthralgias, joint swelling, myalgias and neck pain.  Skin: Negative.  Negative for color change.  Neurological: Negative.  Negative for dizziness, weakness, light-headedness and numbness.    Hematological: Negative for adenopathy. Does not bruise/bleed easily.  Psychiatric/Behavioral: Negative.     Objective:  BP (!) 152/96 (BP Location: Left Arm, Patient Position: Sitting, Cuff Size: Large)   Pulse 61   Temp 98.4 F (36.9 C) (Oral)   Resp 16   Ht 5\' 8"  (1.727 m)   Wt 272 lb (123.4 kg)   SpO2 95%   BMI 41.36 kg/m   BP Readings from Last 3 Encounters:  07/19/18 (!) 152/96  07/11/18 138/78  07/08/17 114/68    Wt Readings from Last 3 Encounters:  07/19/18 272 lb (123.4 kg)  07/11/18 265 lb (120.2 kg)  07/08/17 269 lb 6.4 oz (122.2 kg)    Physical Exam Vitals signs reviewed.  Constitutional:      Appearance: He is obese. He is not ill-appearing.  HENT:     Nose: Nose normal. No congestion or rhinorrhea.     Mouth/Throat:     Mouth: Mucous membranes are moist.     Pharynx: Oropharynx is clear. No oropharyngeal exudate or posterior oropharyngeal erythema.  Eyes:     General: No scleral icterus.    Conjunctiva/sclera: Conjunctivae normal.  Neck:     Musculoskeletal: Normal range of motion and neck supple. No neck rigidity or muscular tenderness.  Cardiovascular:     Rate and Rhythm: Normal rate and regular rhythm.     Heart sounds: No murmur. No gallop.      Comments: EKG ---  Sinus  Rhythm  WITHIN NORMAL LIMITS  Pulmonary:     Effort: Pulmonary effort is normal. No respiratory distress.     Breath sounds: No  stridor. No wheezing, rhonchi or rales.  Abdominal:     General: Abdomen is flat. There is no distension.     Palpations: There is no hepatomegaly, splenomegaly or mass.     Tenderness: There is no abdominal tenderness. There is no guarding.     Hernia: There is no hernia in the right inguinal area or left inguinal area.  Genitourinary:    Pubic Area: No rash.      Penis: Normal and circumcised. No discharge, swelling or lesions.      Scrotum/Testes: Normal.        Right: Mass, tenderness or swelling not present.        Left: Mass,  tenderness or swelling not present.     Epididymis:     Right: Normal.     Left: Normal.     Prostate: Enlarged (1+ smooth symm BPH). Not tender and no nodules present.     Rectum: Normal. Guaiac result negative. No mass, tenderness, anal fissure, external hemorrhoid or internal hemorrhoid. Normal anal tone.  Musculoskeletal:     Right lower leg: Edema (1+ pitting) present.     Left lower leg: Edema (1+ pitting) present.  Lymphadenopathy:     Cervical: No cervical adenopathy.     Lower Body: No right inguinal adenopathy. No left inguinal adenopathy.  Skin:    General: Skin is warm and dry.     Findings: No erythema or rash.  Neurological:     General: No focal deficit present.     Mental Status: He is oriented to person, place, and time. Mental status is at baseline.  Psychiatric:        Mood and Affect: Mood normal.        Behavior: Behavior normal.        Thought Content: Thought content normal.        Judgment: Judgment normal.     Lab Results  Component Value Date   WBC 10.7 (H) 07/19/2018   HGB 16.3 07/19/2018   HCT 47.1 07/19/2018   PLT 287.0 07/19/2018   GLUCOSE 92 07/19/2018   CHOL 174 07/19/2018   TRIG 182.0 (H) 07/19/2018   HDL 35.80 (L) 07/19/2018   LDLDIRECT 98.0 05/12/2017   LDLCALC 102 (H) 07/19/2018   ALT 32 07/19/2018   AST 20 07/19/2018   NA 140 07/19/2018   K 4.2 07/19/2018   CL 104 07/19/2018   CREATININE 1.18 07/19/2018   BUN 16 07/19/2018   CO2 29 07/19/2018   TSH 1.38 07/19/2018   PSA 5.31 (H) 07/19/2018   INR 1.0 01/11/2008   HGBA1C 5.3 07/19/2018    Ct Ankle Left Wo Contrast  Result Date: 08/19/2016 CLINICAL DATA:  Close displaced lateral malleolar fracture. EXAM: CT OF THE LEFT ANKLE WITHOUT CONTRAST TECHNIQUE: Multidetector CT imaging of the left ankle was performed according to the standard protocol. Multiplanar CT image reconstructions were also generated. COMPARISON:  None. FINDINGS: Bones/Joint/Cartilage Oblique mildly comminuted  fracture of the left distal fibular diaphysis with 6 mm of posterior displacement. Mildly comminuted, nondisplaced posterior malleolar fracture of the distal tibia involving the articular surface of the tibial plafond with 1 mm of step-off. Horizontal fracture cleft that involves the posterior malleolus extends medially through the remainder of the articular surface of the tibial plafond and into the medial malleolus. No other fracture or dislocation. Ankle mortise is intact. Subtalar joints are normal. Nondisplaced fracture of the medial malleolus No joint effusion. Ligaments Ligaments are suboptimally evaluated by CT. Muscles and Tendons  The muscles are normal. Flexor, extensor, peroneal and Achilles tendons are grossly intact. Soft tissue No fluid collection or hematoma. No soft tissue mass. Severe soft tissue edema along the lateral aspect of the ankle. Mild soft tissue edema along the medial aspect of the ankle. IMPRESSION: 1. Oblique mildly comminuted fracture of the left distal fibular diaphysis with 6 mm of posterior displacement. 2. Mildly comminuted, nondisplaced posterior malleolar fracture of the distal tibia involving the articular surface of the tibial plafond with 1 mm of step-off. Horizontal fracture cleft that involves the posterior malleolus extends medially through the remainder of the articular surface of the tibial plafond and into the medial malleolus. Electronically Signed   By: Kathreen Devoid   On: 08/19/2016 13:20    Assessment & Plan:   Eashan was seen today for annual exam and hypertension.  Diagnoses and all orders for this visit:  Essential hypertension- His blood pressure is not adequately well controlled and he has lower extremity edema.  His EKG is negative for LVH or ischemia.  Due to the lower extremity edema I have asked him to stop taking amlodipine.  I have asked him to upgrade to an ARB and will restart the thiazide diuretic.  I will treat his vitamin D deficiency.   Otherwise, his labs are negative for secondary causes or endorgan damage. -     CBC with Differential/Platelet; Future -     Comprehensive metabolic panel; Future -     Urinalysis, Routine w reflex microscopic; Future -     TSH; Future -     VITAMIN D 25 Hydroxy (Vit-D Deficiency, Fractures); Future -     Discontinue: Azilsartan-Chlorthalidone (EDARBYCLOR) 40-12.5 MG TABS; Take 1 tablet by mouth daily. -     EKG 12-Lead -     olmesartan-hydrochlorothiazide (BENICAR HCT) 40-12.5 MG tablet; Take 1 tablet by mouth daily.  Morbid obesity ([place])- He was encouraged to improve his lifestyle modifications. -     Hemoglobin A1c; Future  Encounter for general adult medical examination with abnormal findings- Exam completed, labs reviewed, he refused a flu vaccine, screening for colon cancer is up-to-date, patient education material was given. -     Lipid panel; Future -     PSA; Future  Vitamin D deficiency -     Cholecalciferol 50 MCG (2000 UT) TABS; Take 1 tablet (2,000 Units total) by mouth daily.  PSA elevation- His PSA is mildly elevated.  He will return in about 3 months to recheck the PSA.  I have asked him not to ejaculate for about a week prior to rechecking his PSA.   I have discontinued Darcy Georg's ibuprofen, lisinopril-hydrochlorothiazide, amLODipine, and Azilsartan-Chlorthalidone. I am also having him start on Cholecalciferol and olmesartan-hydrochlorothiazide. Additionally, I am having him maintain his aspirin EC and cyanocobalamin.  Meds ordered this encounter  Medications  . DISCONTD: Azilsartan-Chlorthalidone (EDARBYCLOR) 40-12.5 MG TABS    Sig: Take 1 tablet by mouth daily.    Dispense:  90 tablet    Refill:  0  . Cholecalciferol 50 MCG (2000 UT) TABS    Sig: Take 1 tablet (2,000 Units total) by mouth daily.    Dispense:  90 tablet    Refill:  1  . olmesartan-hydrochlorothiazide (BENICAR HCT) 40-12.5 MG tablet    Sig: Take 1 tablet by mouth daily.    Dispense:  90  tablet    Refill:  0     Follow-up: Return in about 3 months (around 10/17/2018).  Scarlette Calico, MD

## 2018-07-20 DIAGNOSIS — R972 Elevated prostate specific antigen [PSA]: Secondary | ICD-10-CM | POA: Insufficient documentation

## 2018-07-20 MED ORDER — OLMESARTAN MEDOXOMIL-HCTZ 40-12.5 MG PO TABS: 1.0000 | ORAL_TABLET | Freq: Every day | ORAL | 0 refills | Status: DC

## 2018-07-20 NOTE — Telephone Encounter (Signed)
Key: [id number]

## 2018-07-20 NOTE — Telephone Encounter (Signed)
PA has been approved.   Mychart message sent to pt informing of same.

## 2018-08-26 DIAGNOSIS — G4733 Obstructive sleep apnea (adult) (pediatric): Secondary | ICD-10-CM | POA: Diagnosis not present

## 2018-09-09 ENCOUNTER — Other Ambulatory Visit: Payer: Self-pay | Admitting: Internal Medicine

## 2018-09-09 DIAGNOSIS — I10 Essential (primary) hypertension: Secondary | ICD-10-CM

## 2018-10-20 ENCOUNTER — Ambulatory Visit: Payer: BLUE CROSS/BLUE SHIELD | Admitting: Internal Medicine

## 2018-11-01 ENCOUNTER — Other Ambulatory Visit (INDEPENDENT_AMBULATORY_CARE_PROVIDER_SITE_OTHER): Payer: BLUE CROSS/BLUE SHIELD

## 2018-11-01 ENCOUNTER — Other Ambulatory Visit: Payer: Self-pay

## 2018-11-01 ENCOUNTER — Encounter: Payer: Self-pay | Admitting: Internal Medicine

## 2018-11-01 ENCOUNTER — Ambulatory Visit (INDEPENDENT_AMBULATORY_CARE_PROVIDER_SITE_OTHER): Payer: BLUE CROSS/BLUE SHIELD | Admitting: Internal Medicine

## 2018-11-01 VITALS — BP 140/80 | HR 65 | Temp 98.5°F | Resp 16 | Ht 68.0 in | Wt 273.0 lb

## 2018-11-01 DIAGNOSIS — I10 Essential (primary) hypertension: Secondary | ICD-10-CM | POA: Diagnosis not present

## 2018-11-01 DIAGNOSIS — R972 Elevated prostate specific antigen [PSA]: Secondary | ICD-10-CM

## 2018-11-01 LAB — BASIC METABOLIC PANEL
BUN: 19 mg/dL (ref 6–23)
CO2: 24 mEq/L (ref 19–32)
Calcium: 9.5 mg/dL (ref 8.4–10.5)
Chloride: 106 mEq/L (ref 96–112)
Creatinine, Ser: 1.19 mg/dL (ref 0.40–1.50)
GFR: 63.66 mL/min (ref >60.00)
Glucose, Bld: 99 mg/dL (ref 70–99)
Potassium: 3.8 mEq/L (ref 3.5–5.1)
Sodium: 140 mEq/L (ref 135–145)

## 2018-11-01 LAB — CBC WITH DIFFERENTIAL/PLATELET
Basophils Absolute: 0 10*3/uL (ref 0.0–0.1)
Basophils Relative: 0.5 % (ref 0.0–3.0)
Eosinophils Absolute: 0.2 10*3/uL (ref 0.0–0.7)
Eosinophils Relative: 2.1 % (ref 0.0–5.0)
HCT: 45.9 % (ref 39.0–52.0)
Hemoglobin: 16.2 g/dL (ref 13.0–17.0)
Lymphocytes Relative: 21.5 % (ref 12.0–46.0)
Lymphs Abs: 2 10*3/uL (ref 0.7–4.0)
MCHC: 35.3 g/dL (ref 30.0–36.0)
MCV: 82.9 fl (ref 78.0–100.0)
Monocytes Absolute: 0.7 10*3/uL (ref 0.1–1.0)
Monocytes Relative: 7.9 % (ref 3.0–12.0)
Neutro Abs: 6.2 10*3/uL (ref 1.4–7.7)
Neutrophils Relative %: 68 % (ref 43.0–77.0)
Platelets: 263 10*3/uL (ref 150.0–400.0)
RBC: 5.54 Mil/uL (ref 4.22–5.81)
RDW: 13.8 % (ref 11.5–15.5)
WBC: 9.1 10*3/uL (ref 4.0–10.5)

## 2018-11-01 NOTE — Progress Notes (Signed)
Subjective:  Patient ID: Heidi Lemay, male    DOB: April 05, 1966  Age: 53 y.o. MRN: [id number]  CC: Hypertension   HPI Javarian Jakubiak presents for f/up - He feels well today and offers no complaints.  He tells me his blood pressure has been relatively well controlled but he has not been working on his lifestyle modifications and has gained a pound since I last saw him.  When he is active he denies any recent episodes of CP, DOE, palpitations, edema, or fatigue.  Outpatient Medications Prior to Visit  Medication Sig Dispense Refill   aspirin EC 81 MG tablet Take 81 mg by mouth daily.     Cholecalciferol 50 MCG (2000 UT) TABS Take 1 tablet (2,000 Units total) by mouth daily. 90 tablet 1   cyanocobalamin 1000 MCG tablet Take 1,000 mcg by mouth daily.     olmesartan-hydrochlorothiazide (BENICAR HCT) 40-12.5 MG tablet TAKE 1 TABLET BY MOUTH EVERY DAY 90 tablet 0   No facility-administered medications prior to visit.     ROS Review of Systems  Constitutional: Negative for diaphoresis, fatigue and unexpected weight change.  HENT: Negative.  Negative for trouble swallowing.   Eyes: Negative for visual disturbance.  Respiratory: Negative for cough, chest tightness, shortness of breath and wheezing.   Cardiovascular: Negative for chest pain, palpitations and leg swelling.  Gastrointestinal: Negative for abdominal pain, constipation, diarrhea, nausea and vomiting.  Genitourinary: Negative.  Negative for difficulty urinating, scrotal swelling and testicular pain.  Musculoskeletal: Negative.  Negative for arthralgias, back pain and myalgias.  Skin: Negative.  Negative for color change and pallor.  Neurological: Negative.  Negative for dizziness, weakness and light-headedness.  Hematological: Negative for adenopathy. Does not bruise/bleed easily.  Psychiatric/Behavioral: Negative.     Objective:  BP 140/80 (BP Location: Left Arm, Patient Position: Sitting, Cuff Size: Large)    Pulse 65     Temp 98.5 F (36.9 C) (Oral)    Resp 16    Ht 5\' 8"  (1.727 m)    Wt 273 lb (123.8 kg)    SpO2 97%    BMI 41.51 kg/m   BP Readings from Last 3 Encounters:  11/01/18 140/80  07/19/18 (!) 152/96  07/11/18 138/78    Wt Readings from Last 3 Encounters:  11/01/18 273 lb (123.8 kg)  07/19/18 272 lb (123.4 kg)  07/11/18 265 lb (120.2 kg)    Physical Exam Vitals signs reviewed.  Constitutional:      Appearance: He is obese. He is not ill-appearing or diaphoretic.  HENT:     Nose: Nose normal.     Mouth/Throat:     Mouth: Mucous membranes are moist.     Pharynx: No oropharyngeal exudate or posterior oropharyngeal erythema.  Eyes:     General: No scleral icterus.    Conjunctiva/sclera: Conjunctivae normal.  Neck:     Musculoskeletal: Normal range of motion. No neck rigidity.  Cardiovascular:     Rate and Rhythm: Normal rate and regular rhythm.     Heart sounds: No murmur. No gallop.   Pulmonary:     Effort: Pulmonary effort is normal. No respiratory distress.     Breath sounds: No stridor. No wheezing, rhonchi or rales.  Abdominal:     General: Abdomen is protuberant.     Palpations: There is no hepatomegaly or splenomegaly.     Tenderness: There is no abdominal tenderness.     Hernia: No hernia is present.  Musculoskeletal: Normal range of motion.  General: No swelling.     Right lower leg: No edema.     Left lower leg: No edema.  Lymphadenopathy:     Cervical: No cervical adenopathy.  Skin:    General: Skin is dry.  Neurological:     General: No focal deficit present.  Psychiatric:        Mood and Affect: Mood normal.        Behavior: Behavior normal.     Lab Results  Component Value Date   WBC 9.1 11/01/2018   HGB 16.2 11/01/2018   HCT 45.9 11/01/2018   PLT 263.0 11/01/2018   GLUCOSE 99 11/01/2018   CHOL 174 07/19/2018   TRIG 182.0 (H) 07/19/2018   HDL 35.80 (L) 07/19/2018   LDLDIRECT 98.0 05/12/2017   LDLCALC 102 (H) 07/19/2018   ALT 32 07/19/2018    AST 20 07/19/2018   NA 140 11/01/2018   K 3.8 11/01/2018   CL 106 11/01/2018   CREATININE 1.19 11/01/2018   BUN 19 11/01/2018   CO2 24 11/01/2018   TSH 1.38 07/19/2018   PSA 4.1 11/02/2018   INR 1.0 01/11/2008   HGBA1C 5.3 07/19/2018    Ct Ankle Left Wo Contrast  Result Date: 08/19/2016 CLINICAL DATA:  Close displaced lateral malleolar fracture. EXAM: CT OF THE LEFT ANKLE WITHOUT CONTRAST TECHNIQUE: Multidetector CT imaging of the left ankle was performed according to the standard protocol. Multiplanar CT image reconstructions were also generated. COMPARISON:  None. FINDINGS: Bones/Joint/Cartilage Oblique mildly comminuted fracture of the left distal fibular diaphysis with 6 mm of posterior displacement. Mildly comminuted, nondisplaced posterior malleolar fracture of the distal tibia involving the articular surface of the tibial plafond with 1 mm of step-off. Horizontal fracture cleft that involves the posterior malleolus extends medially through the remainder of the articular surface of the tibial plafond and into the medial malleolus. No other fracture or dislocation. Ankle mortise is intact. Subtalar joints are normal. Nondisplaced fracture of the medial malleolus No joint effusion. Ligaments Ligaments are suboptimally evaluated by CT. Muscles and Tendons The muscles are normal. Flexor, extensor, peroneal and Achilles tendons are grossly intact. Soft tissue No fluid collection or hematoma. No soft tissue mass. Severe soft tissue edema along the lateral aspect of the ankle. Mild soft tissue edema along the medial aspect of the ankle. IMPRESSION: 1. Oblique mildly comminuted fracture of the left distal fibular diaphysis with 6 mm of posterior displacement. 2. Mildly comminuted, nondisplaced posterior malleolar fracture of the distal tibia involving the articular surface of the tibial plafond with 1 mm of step-off. Horizontal fracture cleft that involves the posterior malleolus extends medially  through the remainder of the articular surface of the tibial plafond and into the medial malleolus. Electronically Signed   By: Kathreen Devoid   On: 08/19/2016 13:20    Assessment & Plan:   Sigmund was seen today for hypertension.  Diagnoses and all orders for this visit:  PSA elevation- His PSA is down to 4.1.  The downward trend over the last few months is reassuring that he does not have prostate cancer.  I recommended to him that we recheck this level in about 6 months. -     PSA, total and free; Future  Essential hypertension- His blood pressure is not quite adequately well controlled.  I have asked him to continue taking the current antihypertensive regimen of an ARB and thiazide diuretic.  I have also asked him to improve his lifestyle modifications with diet/exercise/weight loss. -  CBC with Differential/Platelet; Future -     Basic metabolic panel; Future   I am having Jahid Bonanno maintain his aspirin EC, cyanocobalamin, Cholecalciferol, and olmesartan-hydrochlorothiazide.  No orders of the defined types were placed in this encounter.    Follow-up: No follow-ups on file.  Scarlette Calico, MD

## 2018-11-02 ENCOUNTER — Encounter: Payer: Self-pay | Admitting: Internal Medicine

## 2018-11-02 LAB — PSA, TOTAL AND FREE
PSA, % Free: 17 % (calc) — ABNORMAL LOW (ref >25)
PSA, Free: 0.7 ng/mL
PSA, Total: 4.1 ng/mL — ABNORMAL HIGH (ref <=4.0)

## 2018-11-02 LAB — PSA: PSA: 4.1

## 2018-11-02 NOTE — Patient Instructions (Signed)

## 2018-12-07 ENCOUNTER — Encounter: Payer: Self-pay | Admitting: Internal Medicine

## 2018-12-14 ENCOUNTER — Other Ambulatory Visit: Payer: Self-pay | Admitting: Internal Medicine

## 2018-12-14 DIAGNOSIS — I10 Essential (primary) hypertension: Secondary | ICD-10-CM

## 2018-12-16 DIAGNOSIS — G4733 Obstructive sleep apnea (adult) (pediatric): Secondary | ICD-10-CM | POA: Diagnosis not present

## 2018-12-29 ENCOUNTER — Emergency Department (HOSPITAL_COMMUNITY): Payer: BC Managed Care – PPO

## 2018-12-29 ENCOUNTER — Encounter (HOSPITAL_COMMUNITY): Payer: Self-pay | Admitting: Emergency Medicine

## 2018-12-29 ENCOUNTER — Emergency Department (HOSPITAL_COMMUNITY)
Admission: EM | Admit: 2018-12-29 | Discharge: 2018-12-29 | Disposition: A | Discharge status: Home / Self Care | Payer: BC Managed Care – PPO | Attending: Emergency Medicine | Admitting: Emergency Medicine

## 2018-12-29 ENCOUNTER — Other Ambulatory Visit: Payer: Self-pay

## 2018-12-29 DIAGNOSIS — R52 Pain, unspecified: Secondary | ICD-10-CM | POA: Diagnosis not present

## 2018-12-29 DIAGNOSIS — S40022A Contusion of left upper arm, initial encounter: Secondary | ICD-10-CM | POA: Insufficient documentation

## 2018-12-29 DIAGNOSIS — M542 Cervicalgia: Secondary | ICD-10-CM | POA: Diagnosis not present

## 2018-12-29 DIAGNOSIS — S161XXA Strain of muscle, fascia and tendon at neck level, initial encounter: Secondary | ICD-10-CM

## 2018-12-29 DIAGNOSIS — S20211A Contusion of right front wall of thorax, initial encounter: Secondary | ICD-10-CM | POA: Diagnosis not present

## 2018-12-29 DIAGNOSIS — M25551 Pain in right hip: Secondary | ICD-10-CM | POA: Diagnosis not present

## 2018-12-29 DIAGNOSIS — Y92410 Unspecified street and highway as the place of occurrence of the external cause: Secondary | ICD-10-CM | POA: Diagnosis not present

## 2018-12-29 DIAGNOSIS — I10 Essential (primary) hypertension: Secondary | ICD-10-CM | POA: Insufficient documentation

## 2018-12-29 DIAGNOSIS — S50811A Abrasion of right forearm, initial encounter: Secondary | ICD-10-CM | POA: Diagnosis not present

## 2018-12-29 DIAGNOSIS — S301XXA Contusion of abdominal wall, initial encounter: Secondary | ICD-10-CM

## 2018-12-29 DIAGNOSIS — R51 Headache: Secondary | ICD-10-CM | POA: Diagnosis not present

## 2018-12-29 DIAGNOSIS — S199XXA Unspecified injury of neck, initial encounter: Secondary | ICD-10-CM | POA: Diagnosis present

## 2018-12-29 DIAGNOSIS — Y9389 Activity, other specified: Secondary | ICD-10-CM | POA: Insufficient documentation

## 2018-12-29 DIAGNOSIS — M545 Low back pain: Secondary | ICD-10-CM | POA: Diagnosis not present

## 2018-12-29 DIAGNOSIS — Y999 Unspecified external cause status: Secondary | ICD-10-CM | POA: Diagnosis not present

## 2018-12-29 DIAGNOSIS — Z79899 Other long term (current) drug therapy: Secondary | ICD-10-CM | POA: Insufficient documentation

## 2018-12-29 LAB — COMPREHENSIVE METABOLIC PANEL
ALT: 39 U/L (ref 0–44)
AST: 31 U/L (ref 15–41)
Albumin: 3.8 g/dL (ref 3.5–5.0)
Alkaline Phosphatase: 83 U/L (ref 38–126)
Anion gap: 12 (ref 5–15)
BUN: 18 mg/dL (ref 6–20)
CO2: 23 mmol/L (ref 22–32)
Calcium: 9.5 mg/dL (ref 8.9–10.3)
Chloride: 105 mmol/L (ref 98–111)
Creatinine, Ser: 1.2 mg/dL (ref 0.61–1.24)
GFR calc Af Amer: >60 mL/min (ref >60)
GFR calc non Af Amer: >60 mL/min (ref >60)
Glucose, Bld: 108 mg/dL — ABNORMAL HIGH (ref 70–99)
Potassium: 3.5 mmol/L (ref 3.5–5.1)
Sodium: 140 mmol/L (ref 135–145)
Total Bilirubin: 0.8 mg/dL (ref 0.3–1.2)
Total Protein: 6.2 g/dL — ABNORMAL LOW (ref 6.5–8.1)

## 2018-12-29 LAB — CBC WITH DIFFERENTIAL/PLATELET
Abs Immature Granulocytes: 0.52 10*3/uL — ABNORMAL HIGH (ref 0.00–0.07)
Basophils Absolute: 0.1 10*3/uL (ref 0.0–0.1)
Basophils Relative: 1 %
Eosinophils Absolute: 0.2 10*3/uL (ref 0.0–0.5)
Eosinophils Relative: 2 %
HCT: 50.1 % (ref 39.0–52.0)
Hemoglobin: 16.8 g/dL (ref 13.0–17.0)
Immature Granulocytes: 5 %
Lymphocytes Relative: 25 %
Lymphs Abs: 2.4 10*3/uL (ref 0.7–4.0)
MCH: 28.8 pg (ref 26.0–34.0)
MCHC: 33.5 g/dL (ref 30.0–36.0)
MCV: 85.8 fL (ref 80.0–100.0)
Monocytes Absolute: 0.9 10*3/uL (ref 0.1–1.0)
Monocytes Relative: 9 %
Neutro Abs: 5.6 10*3/uL (ref 1.7–7.7)
Neutrophils Relative %: 58 %
Platelets: 276 10*3/uL (ref 150–400)
RBC: 5.84 MIL/uL — ABNORMAL HIGH (ref 4.22–5.81)
RDW: 13.3 % (ref 11.5–15.5)
WBC: 9.7 10*3/uL (ref 4.0–10.5)
nRBC: 0 % (ref 0.0–0.2)

## 2018-12-29 MED ORDER — KETOROLAC TROMETHAMINE 30 MG/ML IJ SOLN
15.0000 mg | Freq: Once | INTRAMUSCULAR | Status: AC
  Administered 2018-12-29: 15 mg via INTRAVENOUS
  Filled 2018-12-29: qty 1

## 2018-12-29 MED ORDER — FENTANYL CITRATE (PF) 100 MCG/2ML IJ SOLN
50.0000 ug | Freq: Once | INTRAMUSCULAR | Status: AC
  Administered 2018-12-29: 50 ug via INTRAVENOUS
  Filled 2018-12-29: qty 2

## 2018-12-29 MED ORDER — HYDROCODONE-ACETAMINOPHEN 5-325 MG PO TABS: 1.0000 | ORAL_TABLET | Freq: Four times a day (QID) | ORAL | 0 refills | Status: DC | PRN

## 2018-12-29 MED ORDER — FENTANYL CITRATE (PF) 100 MCG/2ML IJ SOLN
INTRAMUSCULAR | Status: AC
  Filled 2018-12-29: qty 2

## 2018-12-29 MED ORDER — CYCLOBENZAPRINE HCL 10 MG PO TABS: 10.0000 mg | ORAL_TABLET | Freq: Two times a day (BID) | ORAL | 0 refills | Status: DC | PRN

## 2018-12-29 MED ORDER — FENTANYL CITRATE (PF) 100 MCG/2ML IJ SOLN
50.0000 ug | Freq: Once | INTRAMUSCULAR | Status: AC
  Administered 2018-12-29: 50 ug via INTRAVENOUS

## 2018-12-29 MED ORDER — IOHEXOL 300 MG/ML  SOLN
100.0000 mL | Freq: Once | INTRAMUSCULAR | Status: AC | PRN
  Administered 2018-12-29: 100 mL via INTRAVENOUS

## 2018-12-29 NOTE — ED Notes (Signed)
Vital signs stable. 
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
## 2018-12-29 NOTE — ED Notes (Signed)
Family at beside. Family given emotional support and prayer with chaplain.
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
## 2018-12-29 NOTE — Progress Notes (Signed)
Pt was awake and alert when I arrived; his wife had just joined him bedside. Upon my introduction, pt asked for prayer. Pt and wife were appreciative of prayer offered and for the visit. Please page if additional support is needed. [place], [place]   12/29/18 0800  Clinical Encounter Type  Visited With Patient and family together
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
## 2018-12-29 NOTE — ED Notes (Signed)
Pt ambulatory with steady gait in hallway. Pt states he felt good walking, endorses pain on left leg/back. Pt endorses no shortness of breath or dizziness.

## 2018-12-29 NOTE — ED Notes (Signed)
Patient verbalizes understanding of discharge instructions. Opportunity for questioning and answers were provided. Pt discharged from ED. 

## 2018-12-30 ENCOUNTER — Encounter: Payer: Self-pay | Admitting: Internal Medicine

## 2019-01-19 ENCOUNTER — Other Ambulatory Visit: Payer: Self-pay | Admitting: Internal Medicine

## 2019-01-19 DIAGNOSIS — E559 Vitamin D deficiency, unspecified: Secondary | ICD-10-CM

## 2019-03-11 ENCOUNTER — Other Ambulatory Visit: Payer: Self-pay | Admitting: Internal Medicine

## 2019-03-11 DIAGNOSIS — I10 Essential (primary) hypertension: Secondary | ICD-10-CM

## 2019-04-24 ENCOUNTER — Ambulatory Visit: Payer: BLUE CROSS/BLUE SHIELD | Admitting: Internal Medicine

## 2019-05-08 ENCOUNTER — Encounter: Payer: Self-pay | Admitting: Gastroenterology

## 2019-05-25 ENCOUNTER — Ambulatory Visit: Payer: BLUE CROSS/BLUE SHIELD | Admitting: Internal Medicine

## 2019-06-12 ENCOUNTER — Other Ambulatory Visit: Payer: Self-pay | Admitting: Internal Medicine

## 2019-06-12 DIAGNOSIS — I10 Essential (primary) hypertension: Secondary | ICD-10-CM

## 2019-07-13 ENCOUNTER — Encounter: Payer: Self-pay | Admitting: Internal Medicine

## 2019-07-13 ENCOUNTER — Ambulatory Visit (INDEPENDENT_AMBULATORY_CARE_PROVIDER_SITE_OTHER): Payer: BC Managed Care – PPO | Admitting: Internal Medicine

## 2019-07-13 ENCOUNTER — Other Ambulatory Visit: Payer: Self-pay

## 2019-07-13 DIAGNOSIS — G4733 Obstructive sleep apnea (adult) (pediatric): Secondary | ICD-10-CM | POA: Diagnosis not present

## 2019-07-13 NOTE — Patient Instructions (Signed)
Order- schedule CPAP mask fitting at [facility]  We can continue VPAP auto 16/13, PS 3, mask of choice, humidifier supplies, [employer]/ card  Please call if we can help

## 2019-07-13 NOTE — Progress Notes (Signed)
Subjective:    Patient ID: Larry Hutchinson, male    DOB: June 16, 1965, 54 y.o.   MRN: [id number]  HPI  male former smoker followed for OSA, complicated by GERD/esophageal stricture, HBP NPSG 07/08/14 AHI  75/ hr, desat to 73%, Weight 250 lbs  ------------------------------------------------------------------------------   07/11/2018- 54 year old male former smoker followed for OSA, complicated by GERD/esophageal stricture, HBP, obesity BIPAP VPAP: IPAP max 16, EPAP mean 13, PS4/ [employer] Body weight today 269 pounds -----Sleeping pretty well - no complaints with CPAP.   Download 100% compliance AHI 1.7/hour He says he "could not make it without my VPAP".  Occasionally the pressure seems a little too high, but better after we turned it down a step last year.  He is prepared to wait and watch before making another change. I asked him routinely about heart symptoms and he mentioned occasional feeling of fullness and palpitation behind upper sternum.  He is going to discuss this at upcoming appointment with his PCP.  07/13/19- 54 year old male former smoker followed for OSA, complicated by GERD/esophageal stricture, HBP, obesity BIPAP VPAP: IPAP max 16, EPAP mean 13, PS4/ [employer] Vauto(BIPAP) 16/13 PS 3/ [employer] Download compliance 100%, AHI 1.2/ hr -----f/u OSA. Patient's breathing is at his baseline.  He asks about alternatives to his full face mask- discussed. Declines flu vax.  ROS-see HPI   + = positive Constitutional:    weight loss, night sweats, fevers, chills, fatigue, lassitude.  HEENT:    +headaches, difficulty swallowing, tooth/dental problems, sore throat,       sneezing, itching, ear ache, nasal congestion, post nasal drip, snoring CV:    chest pain, orthopnea, PND, swelling in lower extremities, anasarca,                                                  dizziness, palpitations Resp:   shortness of breath with exertion or at rest.                productive cough,  +  non-productive cough, coughing up of blood.              change in color of mucus.  wheezing.   Skin:    rash or lesions. GI:  No-   heartburn, indigestion, abdominal pain, nausea, vomiting, diarrhea,                 change in bowel habits, loss of appetite GU: dysuria, change in color of urine, no urgency or frequency.   flank pain. MS:   joint pain, stiffness, decreased range of motion, back pain. Neuro-     nothing unusual Psych:  change in mood or affect.  depression or anxiety.   memory loss.    Objective:  OBJ- Physical Exam General- Alert, Oriented, Affect-appropriate, Distress- none acute, +Obese     Skin- rash-none, lesions- none, excoriation- none Lymphadenopathy- none Head- atraumatic            Eyes- Gross vision intact, PERRLA, conjunctivae and secretions clear            Ears- Hearing, canals-normal            Nose- Clear, no-Septal dev, mucus, polyps, erosion, perforation             Throat- Mallampati IV , mucosa clear , drainage- none, tonsils- atrophic Neck- flexible , trachea midline,  no stridor , thyroid nl, carotid no bruit Chest - symmetrical excursion , unlabored           Heart/CV- RRR today, no murmur , no gallop  , no rub, nl s1 s2                           - JVD- none , edema- none, stasis changes- none, varices- none           Lung- clear to P&A, wheeze- none, cough- none, dullness-none, rub- none           Chest wall-  Abd-  Br/ Gen/ Rectal- Not done, not indicated Extrem- cyanosis- none, clubbing, none, atrophy- none, strength- nl Neuro- grossly intact to observation    Assessment & Plan:

## 2019-07-24 ENCOUNTER — Other Ambulatory Visit: Payer: Self-pay

## 2019-07-24 ENCOUNTER — Encounter: Payer: Self-pay | Admitting: Internal Medicine

## 2019-07-24 ENCOUNTER — Ambulatory Visit: Payer: BC Managed Care – PPO | Admitting: Internal Medicine

## 2019-07-24 VITALS — BP 144/78 | HR 66 | Temp 98.5°F | Resp 16 | Ht 68.0 in | Wt 275.2 lb

## 2019-07-24 DIAGNOSIS — E781 Pure hyperglyceridemia: Secondary | ICD-10-CM

## 2019-07-24 DIAGNOSIS — R972 Elevated prostate specific antigen [PSA]: Secondary | ICD-10-CM

## 2019-07-24 DIAGNOSIS — I10 Essential (primary) hypertension: Secondary | ICD-10-CM | POA: Diagnosis not present

## 2019-07-24 DIAGNOSIS — Z0000 Encounter for general adult medical examination without abnormal findings: Secondary | ICD-10-CM | POA: Diagnosis not present

## 2019-07-24 DIAGNOSIS — E559 Vitamin D deficiency, unspecified: Secondary | ICD-10-CM

## 2019-07-24 DIAGNOSIS — Z0001 Encounter for general adult medical examination with abnormal findings: Secondary | ICD-10-CM

## 2019-07-24 LAB — BASIC METABOLIC PANEL
BUN: 18 mg/dL (ref 6–23)
CO2: 30 mEq/L (ref 19–32)
Calcium: 9.2 mg/dL (ref 8.4–10.5)
Chloride: 105 mEq/L (ref 96–112)
Creatinine, Ser: 1.28 mg/dL (ref 0.40–1.50)
GFR: 58.37 mL/min — ABNORMAL LOW (ref >60.00)
Glucose, Bld: 114 mg/dL — ABNORMAL HIGH (ref 70–99)
Potassium: 3.6 mEq/L (ref 3.5–5.1)
Sodium: 143 mEq/L (ref 135–145)

## 2019-07-24 LAB — HEPATIC FUNCTION PANEL
ALT: 29 U/L (ref 0–53)
AST: 17 U/L (ref 0–37)
Albumin: 4 g/dL (ref 3.5–5.2)
Alkaline Phosphatase: 81 U/L (ref 39–117)
Bilirubin, Direct: 0.1 mg/dL (ref 0.0–0.3)
Total Bilirubin: 0.6 mg/dL (ref 0.2–1.2)
Total Protein: 6.4 g/dL (ref 6.0–8.3)

## 2019-07-24 LAB — LIPID PANEL
Cholesterol: 161 mg/dL (ref 0–200)
HDL: 33.5 mg/dL — ABNORMAL LOW (ref >39.00)
NonHDL: 127.4
Total CHOL/HDL Ratio: 5
Triglycerides: 228 mg/dL — ABNORMAL HIGH (ref 0.0–149.0)
VLDL: 45.6 mg/dL — ABNORMAL HIGH (ref 0.0–40.0)

## 2019-07-24 LAB — URINALYSIS, ROUTINE W REFLEX MICROSCOPIC
Bilirubin Urine: NEGATIVE
Hgb urine dipstick: NEGATIVE
Ketones, ur: NEGATIVE
Leukocytes,Ua: NEGATIVE
Nitrite: NEGATIVE
RBC / HPF: NONE SEEN (ref 0–?)
Specific Gravity, Urine: 1.025 (ref 1.000–1.030)
Total Protein, Urine: NEGATIVE
Urine Glucose: NEGATIVE
Urobilinogen, UA: 0.2 (ref 0.0–1.0)
pH: 6 (ref 5.0–8.0)

## 2019-07-24 LAB — CBC WITH DIFFERENTIAL/PLATELET
Basophils Absolute: 0 10*3/uL (ref 0.0–0.1)
Basophils Relative: 0.3 % (ref 0.0–3.0)
Eosinophils Absolute: 0.2 10*3/uL (ref 0.0–0.7)
Eosinophils Relative: 2.4 % (ref 0.0–5.0)
HCT: 47.2 % (ref 39.0–52.0)
Hemoglobin: 16 g/dL (ref 13.0–17.0)
Lymphocytes Relative: 20.7 % (ref 12.0–46.0)
Lymphs Abs: 1.9 10*3/uL (ref 0.7–4.0)
MCHC: 33.9 g/dL (ref 30.0–36.0)
MCV: 84.9 fl (ref 78.0–100.0)
Monocytes Absolute: 0.7 10*3/uL (ref 0.1–1.0)
Monocytes Relative: 7.4 % (ref 3.0–12.0)
Neutro Abs: 6.4 10*3/uL (ref 1.4–7.7)
Neutrophils Relative %: 69.2 % (ref 43.0–77.0)
Platelets: 280 10*3/uL (ref 150.0–400.0)
RBC: 5.55 Mil/uL (ref 4.22–5.81)
RDW: 13.9 % (ref 11.5–15.5)
WBC: 9.3 10*3/uL (ref 4.0–10.5)

## 2019-07-24 LAB — TSH: TSH: 2.1 u[IU]/mL (ref 0.35–4.50)

## 2019-07-24 LAB — VITAMIN D 25 HYDROXY (VIT D DEFICIENCY, FRACTURES): VITD: 29.38 ng/mL — ABNORMAL LOW (ref 30.00–100.00)

## 2019-07-24 LAB — LDL CHOLESTEROL, DIRECT: Direct LDL: 99 mg/dL

## 2019-07-24 LAB — HEMOGLOBIN A1C: Hgb A1c MFr Bld: 5.4 % (ref 4.6–6.5)

## 2019-07-24 LAB — PSA: PSA: 4.3

## 2019-07-24 MED ORDER — OLMESARTAN MEDOXOMIL-HCTZ 40-12.5 MG PO TABS: 1.0000 | ORAL_TABLET | Freq: Every day | ORAL | 1 refills | Status: DC

## 2019-07-24 MED ORDER — VITAMIN D 50 MCG (2000 UT) PO TABS: 2000.0000 [IU] | ORAL_TABLET | Freq: Every day | ORAL | 1 refills | Status: DC
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
## 2019-07-24 NOTE — Patient Instructions (Signed)

## 2019-07-25 ENCOUNTER — Encounter: Payer: Self-pay | Admitting: Internal Medicine

## 2019-07-25 DIAGNOSIS — E781 Pure hyperglyceridemia: Secondary | ICD-10-CM | POA: Insufficient documentation

## 2019-07-25 LAB — PSA, TOTAL AND FREE
PSA, % Free: 19 % (calc) — ABNORMAL LOW (ref >25)
PSA, Free: 0.8 ng/mL
PSA, Total: 4.3 ng/mL — ABNORMAL HIGH (ref <=4.0)

## 2019-07-25 NOTE — Assessment & Plan Note (Signed)
Benefits with good compliance and control. Has adjusted well to VPAP. Plan continue VPAP 16/13, PS 3. Ok to explore mask styles with [employer].

## 2019-07-25 NOTE — Assessment & Plan Note (Signed)
Encouraged to make more effort toward weight loss. Consider Bariatric consult.

## 2019-08-10 DIAGNOSIS — G4733 Obstructive sleep apnea (adult) (pediatric): Secondary | ICD-10-CM | POA: Diagnosis not present

## 2019-11-21 DIAGNOSIS — G4733 Obstructive sleep apnea (adult) (pediatric): Secondary | ICD-10-CM | POA: Diagnosis not present

## 2020-01-22 ENCOUNTER — Ambulatory Visit: Payer: BC Managed Care – PPO | Admitting: Internal Medicine

## 2020-03-05 ENCOUNTER — Ambulatory Visit (INDEPENDENT_AMBULATORY_CARE_PROVIDER_SITE_OTHER): Payer: BC Managed Care – PPO | Admitting: Internal Medicine

## 2020-03-05 ENCOUNTER — Ambulatory Visit (INDEPENDENT_AMBULATORY_CARE_PROVIDER_SITE_OTHER): Payer: BC Managed Care – PPO

## 2020-03-05 ENCOUNTER — Other Ambulatory Visit: Payer: Self-pay | Admitting: Internal Medicine

## 2020-03-05 ENCOUNTER — Encounter: Payer: Self-pay | Admitting: Internal Medicine

## 2020-03-05 ENCOUNTER — Other Ambulatory Visit: Payer: Self-pay

## 2020-03-05 VITALS — BP 152/94 | HR 62 | Temp 98.0°F | Resp 16 | Ht 68.0 in | Wt 277.0 lb

## 2020-03-05 DIAGNOSIS — Z1159 Encounter for screening for other viral diseases: Secondary | ICD-10-CM | POA: Diagnosis not present

## 2020-03-05 DIAGNOSIS — E559 Vitamin D deficiency, unspecified: Secondary | ICD-10-CM

## 2020-03-05 DIAGNOSIS — G8929 Other chronic pain: Secondary | ICD-10-CM | POA: Diagnosis not present

## 2020-03-05 DIAGNOSIS — R972 Elevated prostate specific antigen [PSA]: Secondary | ICD-10-CM

## 2020-03-05 DIAGNOSIS — E781 Pure hyperglyceridemia: Secondary | ICD-10-CM | POA: Diagnosis not present

## 2020-03-05 DIAGNOSIS — I10 Essential (primary) hypertension: Secondary | ICD-10-CM | POA: Diagnosis not present

## 2020-03-05 DIAGNOSIS — M25532 Pain in left wrist: Secondary | ICD-10-CM

## 2020-03-05 DIAGNOSIS — M546 Pain in thoracic spine: Secondary | ICD-10-CM | POA: Diagnosis not present

## 2020-03-05 DIAGNOSIS — M25531 Pain in right wrist: Secondary | ICD-10-CM

## 2020-03-05 LAB — PSA: PSA: 4.3

## 2020-03-05 MED ORDER — OLMESARTAN MEDOXOMIL-HCTZ 40-12.5 MG PO TABS: 1.0000 | ORAL_TABLET | Freq: Every day | ORAL | 1 refills | Status: DC

## 2020-03-05 MED ORDER — VITAMIN D 50 MCG (2000 UT) PO TABS
2000.0000 [IU] | ORAL_TABLET | Freq: Every day | ORAL | 1 refills | Status: AC
Start: 2020-03-05 — End: ?
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
## 2020-03-05 NOTE — Patient Instructions (Signed)

## 2020-03-06 LAB — PSA, TOTAL AND FREE
PSA, % Free: 19 % (calc) — ABNORMAL LOW (ref >25)
PSA, Free: 0.8 ng/mL
PSA, Total: 4.3 ng/mL — ABNORMAL HIGH (ref <=4.0)

## 2020-03-06 LAB — BASIC METABOLIC PANEL WITH GFR
BUN: 16 mg/dL (ref 7–25)
CO2: 27 mmol/L (ref 20–32)
Calcium: 9.8 mg/dL (ref 8.6–10.3)
Chloride: 103 mmol/L (ref 98–110)
Creat: 1.26 mg/dL (ref 0.70–1.33)
GFR, Est African American: 74 mL/min/{1.73_m2} (ref >=60)
GFR, Est Non African American: 64 mL/min/{1.73_m2} (ref >=60)
Glucose, Bld: 92 mg/dL (ref 65–99)
Potassium: 3.9 mmol/L (ref 3.5–5.3)
Sodium: 140 mmol/L (ref 135–146)

## 2020-03-06 LAB — LIPID PANEL
Cholesterol: 161 mg/dL (ref <200)
HDL: 36 mg/dL — ABNORMAL LOW (ref >=40)
LDL Cholesterol (Calc): 97 mg/dL (calc)
Non-HDL Cholesterol (Calc): 125 mg/dL (calc) (ref <130)
Total CHOL/HDL Ratio: 4.5 (calc) (ref <5.0)
Triglycerides: 181 mg/dL — ABNORMAL HIGH (ref <150)

## 2020-03-06 LAB — VITAMIN D 25 HYDROXY (VIT D DEFICIENCY, FRACTURES): Vit D, 25-Hydroxy: 49 ng/mL (ref 30–100)

## 2020-03-06 LAB — HEPATITIS C ANTIBODY
Hepatitis C Ab: NONREACTIVE
SIGNAL TO CUT-OFF: 0.01 (ref <1.00)

## 2020-03-10 MED ORDER — IBUPROFEN 200 MG PO TABS: 600.0000 mg | ORAL_TABLET | ORAL | 1 refills | Status: DC | PRN

## 2020-03-22 DIAGNOSIS — G4733 Obstructive sleep apnea (adult) (pediatric): Secondary | ICD-10-CM | POA: Diagnosis not present

## 2020-03-27 DIAGNOSIS — D2239 Melanocytic nevi of other parts of face: Secondary | ICD-10-CM | POA: Diagnosis not present

## 2020-03-27 DIAGNOSIS — D0372 Melanoma in situ of left lower limb, including hip: Secondary | ICD-10-CM | POA: Diagnosis not present

## 2020-03-27 DIAGNOSIS — L820 Inflamed seborrheic keratosis: Secondary | ICD-10-CM | POA: Diagnosis not present

## 2020-03-27 DIAGNOSIS — D1801 Hemangioma of skin and subcutaneous tissue: Secondary | ICD-10-CM | POA: Diagnosis not present

## 2020-03-29 ENCOUNTER — Encounter: Payer: Self-pay | Admitting: Internal Medicine

## 2020-04-11 DIAGNOSIS — D0371 Melanoma in situ of right lower limb, including hip: Secondary | ICD-10-CM | POA: Diagnosis not present

## 2020-04-11 DIAGNOSIS — L988 Other specified disorders of the skin and subcutaneous tissue: Secondary | ICD-10-CM | POA: Diagnosis not present

## 2020-04-24 ENCOUNTER — Other Ambulatory Visit: Payer: Self-pay

## 2020-04-24 ENCOUNTER — Ambulatory Visit (INDEPENDENT_AMBULATORY_CARE_PROVIDER_SITE_OTHER): Payer: BC Managed Care – PPO | Admitting: Neurology

## 2020-04-24 ENCOUNTER — Encounter (INDEPENDENT_AMBULATORY_CARE_PROVIDER_SITE_OTHER): Payer: BC Managed Care – PPO | Admitting: Neurology

## 2020-04-24 DIAGNOSIS — R202 Paresthesia of skin: Secondary | ICD-10-CM

## 2020-04-24 DIAGNOSIS — Z0289 Encounter for other administrative examinations: Secondary | ICD-10-CM

## 2020-04-24 NOTE — Procedures (Signed)
Full Name: Larry Hutchinson Gender: Male MRN #: [id number] Date of Birth: 03-12-1966    Visit Date: 04/24/2020 07:16 Age: 54 Years Examining Physician: Marcial Pacas, MD  Referring Physician: Scarlette Calico, MD History: 54 year old male with intermittent bilateral upper extremity paresthesia, especially right fourth and fifth finger paresthesia, right elbow sensitivity  Summary of the test: Nerve conduction study: Bilateral median sensory motor responses were normal.  Bilateral ulnar sensory responses were normal.  Right ulnar motor responses were normal  Left ulnar motor response showed 14 m/s velocity drop across the elbow,  Electromyography: Selected needle examination of right upper extremity muscles and left upper extremity muscles were normal.   Conclusion: This is an abnormal study.  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    ------------------------------- Marcial Pacas, M.D. PhD  [facility] [address], [place] [address] Tel: [phone] Fax: [phone]  Verbal informed consent was obtained from the patient, patient was informed of potential risk of procedure, including bruising, bleeding, hematoma formation, infection, muscle weakness, muscle pain, numbness, among others.         [place]    Nerve / Sites Muscle Latency Ref. Amplitude Ref. Rel Amp Segments Distance Velocity Ref. Area    ms ms mV mV %  cm m/s m/s mVms  R Median - APB     Wrist APB 3.4 ?4.4 5.4 ?4.0 100 Wrist - APB 7   21.8     Upper arm APB 7.5  5.2  96.2 Upper arm - Wrist 23 56 ?49 22.0  L Median - APB     Wrist APB 3.5 ?4.4 6.8 ?4.0 100 Wrist - APB 7   27.4     Upper arm APB 7.6  6.1  89.6 Upper arm - Wrist 23 56 ?49 24.6  R Ulnar - ADM     Wrist ADM 2.8 ?3.3 10.5 ?6.0 100 Wrist - ADM 7   25.6     B.Elbow ADM 6.6  7.7  72.8 B.Elbow - Wrist 20  51 ?49 23.5     A.Elbow ADM 8.7  8.3  109 A.Elbow - B.Elbow 10 49 ?49 26.2         A.Elbow - Wrist      L Ulnar - ADM     Wrist ADM 3.3 ?3.3 8.9 ?6.0 100 Wrist - ADM 7   31.3     B.Elbow ADM 6.4  7.0  79.3 B.Elbow - Wrist 19 61 ?49 22.9     A.Elbow ADM 8.5  8.3  119 A.Elbow - B.Elbow 10 47 ?49 28.3         A.Elbow - Wrist                 SNC    Nerve / Sites Rec. Site Peak Lat Ref.  Amp Ref. Segments Distance Peak Diff Ref.    ms ms V V  cm ms ms  R Radial - Anatomical snuff box (Forearm)     Forearm Wrist 2.4 ?2.9 21 ?15 Forearm - Wrist 10    L Radial - Anatomical snuff box (Forearm)     Forearm Wrist 2.5 ?2.9 16 ?15 Forearm - Wrist 10    R Median, Ulnar - Transcarpal comparison     Median Palm Wrist 2.1 ?2.2 60 ?35 Median Palm - Wrist 8       Ulnar Palm Wrist  1.8 ?2.2 22 ?12 Ulnar Palm - Wrist 8          Median Palm - Ulnar Palm  0.3 ?0.4  L Median, Ulnar - Transcarpal comparison     Median Palm Wrist 2.2 ?2.2 65 ?35 Median Palm - Wrist 8       Ulnar Palm Wrist 2.0 ?2.2 13 ?12 Ulnar Palm - Wrist 8          Median Palm - Ulnar Palm  0.2 ?0.4  R Median - Orthodromic (Dig II, Mid palm)     Dig II Wrist 3.0 ?3.4 13 ?10 Dig II - Wrist 13    L Median - Orthodromic (Dig II, Mid palm)     Dig II Wrist 3.2 ?3.4 14 ?10 Dig II - Wrist 13    R Ulnar - Orthodromic, (Dig V, Mid palm)     Dig V Wrist 2.8 ?3.1 6 ?5 Dig V - Wrist 11    L Ulnar - Orthodromic, (Dig V, Mid palm)     Dig V Wrist 2.8 ?3.1 6 ?5 Dig V - Wrist 74                       F  Wave    Nerve F Lat Ref.   ms ms  R Ulnar - ADM 28.4 ?32.0  L Ulnar - ADM 30.3 ?32.0         EMG Summary Table    Spontaneous MUAP Recruitment  Muscle IA Fib PSW Fasc Other Amp Dur. Poly Pattern  R. First dorsal interosseous Normal None None None _______ Normal Normal Normal Normal  R. Pronator teres Normal None None None _______ Normal Normal Normal Normal  R. Biceps brachii Normal None None None _______ Normal Normal Normal Normal  R.  Deltoid Normal None None None _______ Normal Normal Normal Normal  R. Triceps brachii Normal None None None _______ Normal Normal Normal Normal  R. Extensor digitorum communis Normal None None None _______ Normal Normal Normal Normal  L. First dorsal interosseous Normal None None None _______ Normal Normal Normal Normal  L. Pronator teres Normal None None None _______ Normal Normal Normal Normal  L. Flexor digitorum profundus (Ulnar) Normal None None None _______ Normal Normal Normal Normal  R. Flexor digitorum profundus (Ulnar) Normal None None None _______ Normal Normal Normal Normal  L. Extensor digitorum communis Normal None None None _______ Normal Normal Normal Normal

## 2020-05-06 ENCOUNTER — Other Ambulatory Visit: Payer: Self-pay | Admitting: Internal Medicine

## 2020-05-06 DIAGNOSIS — G5622 Lesion of ulnar nerve, left upper limb: Secondary | ICD-10-CM | POA: Insufficient documentation

## 2020-06-03 ENCOUNTER — Other Ambulatory Visit: Payer: Self-pay

## 2020-06-04 ENCOUNTER — Encounter: Payer: Self-pay | Admitting: Internal Medicine

## 2020-06-04 ENCOUNTER — Ambulatory Visit (INDEPENDENT_AMBULATORY_CARE_PROVIDER_SITE_OTHER): Payer: BC Managed Care – PPO | Admitting: Internal Medicine

## 2020-06-04 VITALS — BP 132/80 | HR 60 | Temp 98.3°F | Resp 16 | Ht 68.0 in | Wt 238.0 lb

## 2020-06-04 DIAGNOSIS — I10 Essential (primary) hypertension: Secondary | ICD-10-CM

## 2020-06-04 DIAGNOSIS — R972 Elevated prostate specific antigen [PSA]: Secondary | ICD-10-CM | POA: Diagnosis not present

## 2020-06-04 DIAGNOSIS — G5622 Lesion of ulnar nerve, left upper limb: Secondary | ICD-10-CM | POA: Diagnosis not present

## 2020-06-04 LAB — BASIC METABOLIC PANEL
BUN: 21 mg/dL (ref 6–23)
CO2: 30 mEq/L (ref 19–32)
Calcium: 9.5 mg/dL (ref 8.4–10.5)
Chloride: 107 mEq/L (ref 96–112)
Creatinine, Ser: 1.17 mg/dL (ref 0.40–1.50)
GFR: 70.05 mL/min (ref >60.00)
Glucose, Bld: 102 mg/dL — ABNORMAL HIGH (ref 70–99)
Potassium: 4.5 mEq/L (ref 3.5–5.1)
Sodium: 142 mEq/L (ref 135–145)

## 2020-06-04 LAB — PSA: PSA: 4.9
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
## 2020-06-04 NOTE — Patient Instructions (Signed)

## 2020-06-05 LAB — PSA, TOTAL AND FREE
PSA, % Free: 20 % (calc) — ABNORMAL LOW (ref >25)
PSA, Free: 1 ng/mL
PSA, Total: 4.9 ng/mL — ABNORMAL HIGH (ref <=4.0)
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
## 2020-07-15 ENCOUNTER — Other Ambulatory Visit: Payer: Self-pay

## 2020-07-15 ENCOUNTER — Encounter: Payer: Self-pay | Admitting: Internal Medicine

## 2020-07-15 ENCOUNTER — Ambulatory Visit (INDEPENDENT_AMBULATORY_CARE_PROVIDER_SITE_OTHER): Payer: BC Managed Care – PPO | Admitting: Internal Medicine

## 2020-07-15 DIAGNOSIS — G4733 Obstructive sleep apnea (adult) (pediatric): Secondary | ICD-10-CM | POA: Diagnosis not present

## 2020-07-15 NOTE — Assessment & Plan Note (Signed)
Benefits from using his BIPAP and seems comfortable with good compliance and control. Denies mechanical problems. His weight loss helps.  Plan- continue VPAP 16/13, PS3

## 2020-07-15 NOTE — Patient Instructions (Signed)
We can continue VPAP 16/13, PS 3, mask of choice, humidifier, supplies, Air/view/ card  Please call if we can help

## 2020-07-15 NOTE — Assessment & Plan Note (Signed)
He has accomplished weight loss with a diet plan. He has a goal of 200 lbs, but admits slipping some this winter. Encouraged to stick with it with support from his PCP.

## 2020-09-10 ENCOUNTER — Encounter: Payer: Self-pay | Admitting: Internal Medicine

## 2020-10-01 ENCOUNTER — Other Ambulatory Visit: Payer: Self-pay | Admitting: Internal Medicine

## 2020-10-01 DIAGNOSIS — I10 Essential (primary) hypertension: Secondary | ICD-10-CM

## 2020-10-23 DIAGNOSIS — Z85820 Personal history of malignant melanoma of skin: Secondary | ICD-10-CM | POA: Diagnosis not present

## 2020-10-23 DIAGNOSIS — D2262 Melanocytic nevi of left upper limb, including shoulder: Secondary | ICD-10-CM | POA: Diagnosis not present

## 2020-10-23 DIAGNOSIS — L814 Other melanin hyperpigmentation: Secondary | ICD-10-CM | POA: Diagnosis not present

## 2020-10-23 DIAGNOSIS — D225 Melanocytic nevi of trunk: Secondary | ICD-10-CM | POA: Diagnosis not present

## 2020-10-25 DIAGNOSIS — G4733 Obstructive sleep apnea (adult) (pediatric): Secondary | ICD-10-CM | POA: Diagnosis not present

## 2020-12-03 ENCOUNTER — Other Ambulatory Visit: Payer: Self-pay

## 2020-12-03 ENCOUNTER — Encounter: Payer: Self-pay | Admitting: Internal Medicine

## 2020-12-03 ENCOUNTER — Ambulatory Visit (INDEPENDENT_AMBULATORY_CARE_PROVIDER_SITE_OTHER): Payer: BC Managed Care – PPO | Admitting: Internal Medicine

## 2020-12-03 VITALS — BP 138/88 | HR 57 | Temp 98.1°F | Resp 16 | Ht 68.0 in | Wt 239.0 lb

## 2020-12-03 DIAGNOSIS — I10 Essential (primary) hypertension: Secondary | ICD-10-CM

## 2020-12-03 DIAGNOSIS — Z0001 Encounter for general adult medical examination with abnormal findings: Secondary | ICD-10-CM

## 2020-12-03 DIAGNOSIS — R972 Elevated prostate specific antigen [PSA]: Secondary | ICD-10-CM | POA: Diagnosis not present

## 2020-12-03 DIAGNOSIS — R739 Hyperglycemia, unspecified: Secondary | ICD-10-CM

## 2020-12-03 LAB — URINALYSIS, ROUTINE W REFLEX MICROSCOPIC
Bilirubin Urine: NEGATIVE
Hgb urine dipstick: NEGATIVE
Ketones, ur: NEGATIVE
Leukocytes,Ua: NEGATIVE
Nitrite: NEGATIVE
RBC / HPF: NONE SEEN (ref 0–?)
Specific Gravity, Urine: 1.025 (ref 1.000–1.030)
Total Protein, Urine: NEGATIVE
Urine Glucose: NEGATIVE
Urobilinogen, UA: 0.2 (ref 0.0–1.0)
WBC, UA: NONE SEEN (ref 0–?)
pH: 6 (ref 5.0–8.0)

## 2020-12-03 LAB — LIPID PANEL
Cholesterol: 170 mg/dL (ref 0–200)
HDL: 41.9 mg/dL (ref >39.00)
LDL Cholesterol: 104 mg/dL — ABNORMAL HIGH (ref 0–99)
NonHDL: 128.07
Total CHOL/HDL Ratio: 4
Triglycerides: 119 mg/dL (ref 0.0–149.0)
VLDL: 23.8 mg/dL (ref 0.0–40.0)

## 2020-12-03 LAB — CBC WITH DIFFERENTIAL/PLATELET
Basophils Absolute: 0 10*3/uL (ref 0.0–0.1)
Basophils Relative: 0.4 % (ref 0.0–3.0)
Eosinophils Absolute: 0.2 10*3/uL (ref 0.0–0.7)
Eosinophils Relative: 2.5 % (ref 0.0–5.0)
HCT: 46.3 % (ref 39.0–52.0)
Hemoglobin: 15.8 g/dL (ref 13.0–17.0)
Lymphocytes Relative: 25.2 % (ref 12.0–46.0)
Lymphs Abs: 2.1 10*3/uL (ref 0.7–4.0)
MCHC: 34.2 g/dL (ref 30.0–36.0)
MCV: 85.4 fl (ref 78.0–100.0)
Monocytes Absolute: 0.8 10*3/uL (ref 0.1–1.0)
Monocytes Relative: 10.2 % (ref 3.0–12.0)
Neutro Abs: 5.1 10*3/uL (ref 1.4–7.7)
Neutrophils Relative %: 61.7 % (ref 43.0–77.0)
Platelets: 238 10*3/uL (ref 150.0–400.0)
RBC: 5.42 Mil/uL (ref 4.22–5.81)
RDW: 14 % (ref 11.5–15.5)
WBC: 8.3 10*3/uL (ref 4.0–10.5)

## 2020-12-03 LAB — BASIC METABOLIC PANEL
BUN: 20 mg/dL (ref 6–23)
CO2: 29 mEq/L (ref 19–32)
Calcium: 9.4 mg/dL (ref 8.4–10.5)
Chloride: 104 mEq/L (ref 96–112)
Creatinine, Ser: 1.15 mg/dL (ref 0.40–1.50)
GFR: 71.27 mL/min (ref >60.00)
Glucose, Bld: 92 mg/dL (ref 70–99)
Potassium: 3.7 mEq/L (ref 3.5–5.1)
Sodium: 141 mEq/L (ref 135–145)

## 2020-12-03 LAB — TSH: TSH: 1.3 u[IU]/mL (ref 0.35–4.50)

## 2020-12-03 LAB — HEMOGLOBIN A1C: Hgb A1c MFr Bld: 5.3 % (ref 4.6–6.5)

## 2020-12-03 LAB — PSA: PSA: 5.57 ng/mL — ABNORMAL HIGH (ref 0.10–4.00)

## 2020-12-03 NOTE — Patient Instructions (Signed)
Health Maintenance, Male Adopting a healthy lifestyle and getting preventive care are important in promoting health and wellness. Ask your health care provider about: The right schedule for you to have regular tests and exams. Things you can do on your own to prevent diseases and keep yourself healthy. What should I know about diet, weight, and exercise? Eat a healthy diet  Eat a diet that includes plenty of vegetables, fruits, low-fat dairy products, and lean protein. Do not eat a lot of foods that are high in solid fats, added sugars, or sodium.  Maintain a healthy weight Body mass index (BMI) is a measurement that can be used to identify possible weight problems. It estimates body fat based on height and weight. Your health care provider can help determine your BMI and help you achieve or maintain ahealthy weight. Get regular exercise Get regular exercise. This is one of the most important things you can do for your health. Most adults should: Exercise for at least 150 minutes each week. The exercise should increase your heart rate and make you sweat (moderate-intensity exercise). Do strengthening exercises at least twice a week. This is in addition to the moderate-intensity exercise. Spend less time sitting. Even light physical activity can be beneficial. Watch cholesterol and blood lipids Have your blood tested for lipids and cholesterol at 56 years of age, then havethis test every 5 years. You may need to have your cholesterol levels checked more often if: Your lipid or cholesterol levels are high. You are older than 56 years of age. You are at high risk for heart disease. What should I know about cancer screening? Many types of cancers can be detected early and may often be prevented. Depending on your health history and family history, you may need to have cancer screening at various ages. This may include screening for: Colorectal cancer. Prostate cancer. Skin cancer. Lung  cancer. What should I know about heart disease, diabetes, and high blood pressure? Blood pressure and heart disease High blood pressure causes heart disease and increases the risk of stroke. This is more likely to develop in people who have high blood pressure readings, are of African descent, or are overweight. Talk with your health care provider about your target blood pressure readings. Have your blood pressure checked: Every 3-5 years if you are 18-39 years of age. Every year if you are 40 years old or older. If you are between the ages of 65 and 75 and are a current or former smoker, ask your health care provider if you should have a one-time screening for abdominal aortic aneurysm (AAA). Diabetes Have regular diabetes screenings. This checks your fasting blood sugar level. Have the screening done: Once every three years after age 45 if you are at a normal weight and have a low risk for diabetes. More often and at a younger age if you are overweight or have a high risk for diabetes. What should I know about preventing infection? Hepatitis B If you have a higher risk for hepatitis B, you should be screened for this virus. Talk with your health care provider to find out if you are at risk forhepatitis B infection. Hepatitis C Blood testing is recommended for: Everyone born from 1945 through 1965. Anyone with known risk factors for hepatitis C. Sexually transmitted infections (STIs) You should be screened each year for STIs, including gonorrhea and chlamydia, if: You are sexually active and are younger than 56 years of age. You are older than 56 years of age   and your health care provider tells you that you are at risk for this type of infection. Your sexual activity has changed since you were last screened, and you are at increased risk for chlamydia or gonorrhea. Ask your health care provider if you are at risk. Ask your health care provider about whether you are at high risk for HIV.  Your health care provider may recommend a prescription medicine to help prevent HIV infection. If you choose to take medicine to prevent HIV, you should first get tested for HIV. You should then be tested every 3 months for as long as you are taking the medicine. Follow these instructions at home: Lifestyle Do not use any products that contain nicotine or tobacco, such as cigarettes, e-cigarettes, and chewing tobacco. If you need help quitting, ask your health care provider. Do not use street drugs. Do not share needles. Ask your health care provider for help if you need support or information about quitting drugs. Alcohol use Do not drink alcohol if your health care provider tells you not to drink. If you drink alcohol: Limit how much you have to 0-2 drinks a day. Be aware of how much alcohol is in your drink. In the [place] one drink equals one 12 oz bottle of beer (355 mL), one 5 oz glass of wine (148 mL), or one 1 oz glass of hard liquor (44 mL). General instructions Schedule regular health, dental, and eye exams. Stay current with your vaccines. Tell your health care provider if: You often feel depressed. You have ever been abused or do not feel safe at home. Summary Adopting a healthy lifestyle and getting preventive care are important in promoting health and wellness. Follow your health care provider's instructions about healthy diet, exercising, and getting tested or screened for diseases. Follow your health care provider's instructions on monitoring your cholesterol and blood pressure. This information is not intended to replace advice given to you by your health care provider. Make sure you discuss any questions you have with your healthcare provider. Document Revised: 05/18/2018 Document Reviewed: 05/18/2018 Elsevier Patient Education  2022 [employer]  
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
## 2020-12-21 IMAGING — DX DG THORACIC SPINE 3V
3 series · 3 of 3 positions shown · non-contrast
Comparison: None.

CLINICAL DATA: Chronic right-sided thoracic spine pain.

EXAM:
THORACIC SPINE - 3 VIEWS

[t-spine ap]
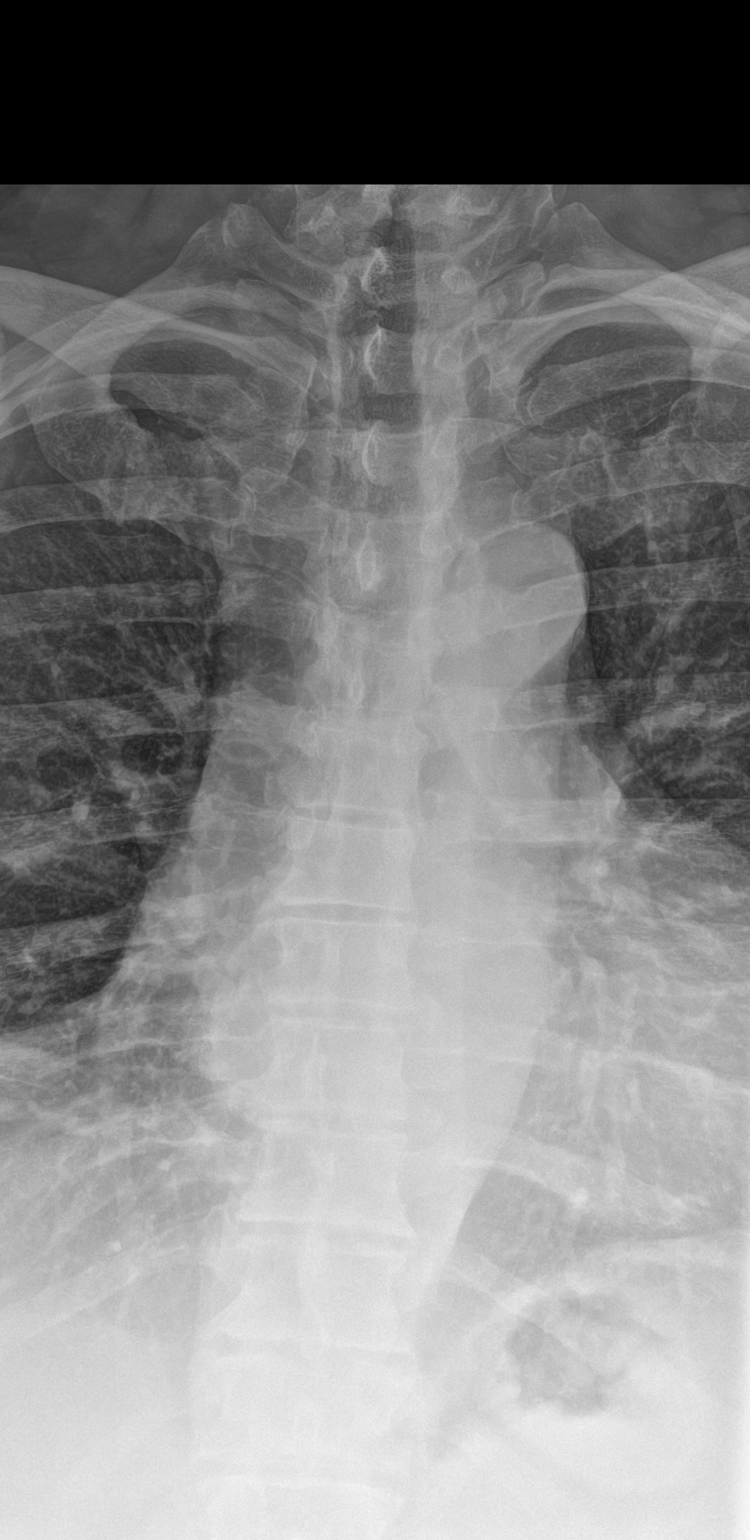

[t-spine lat]
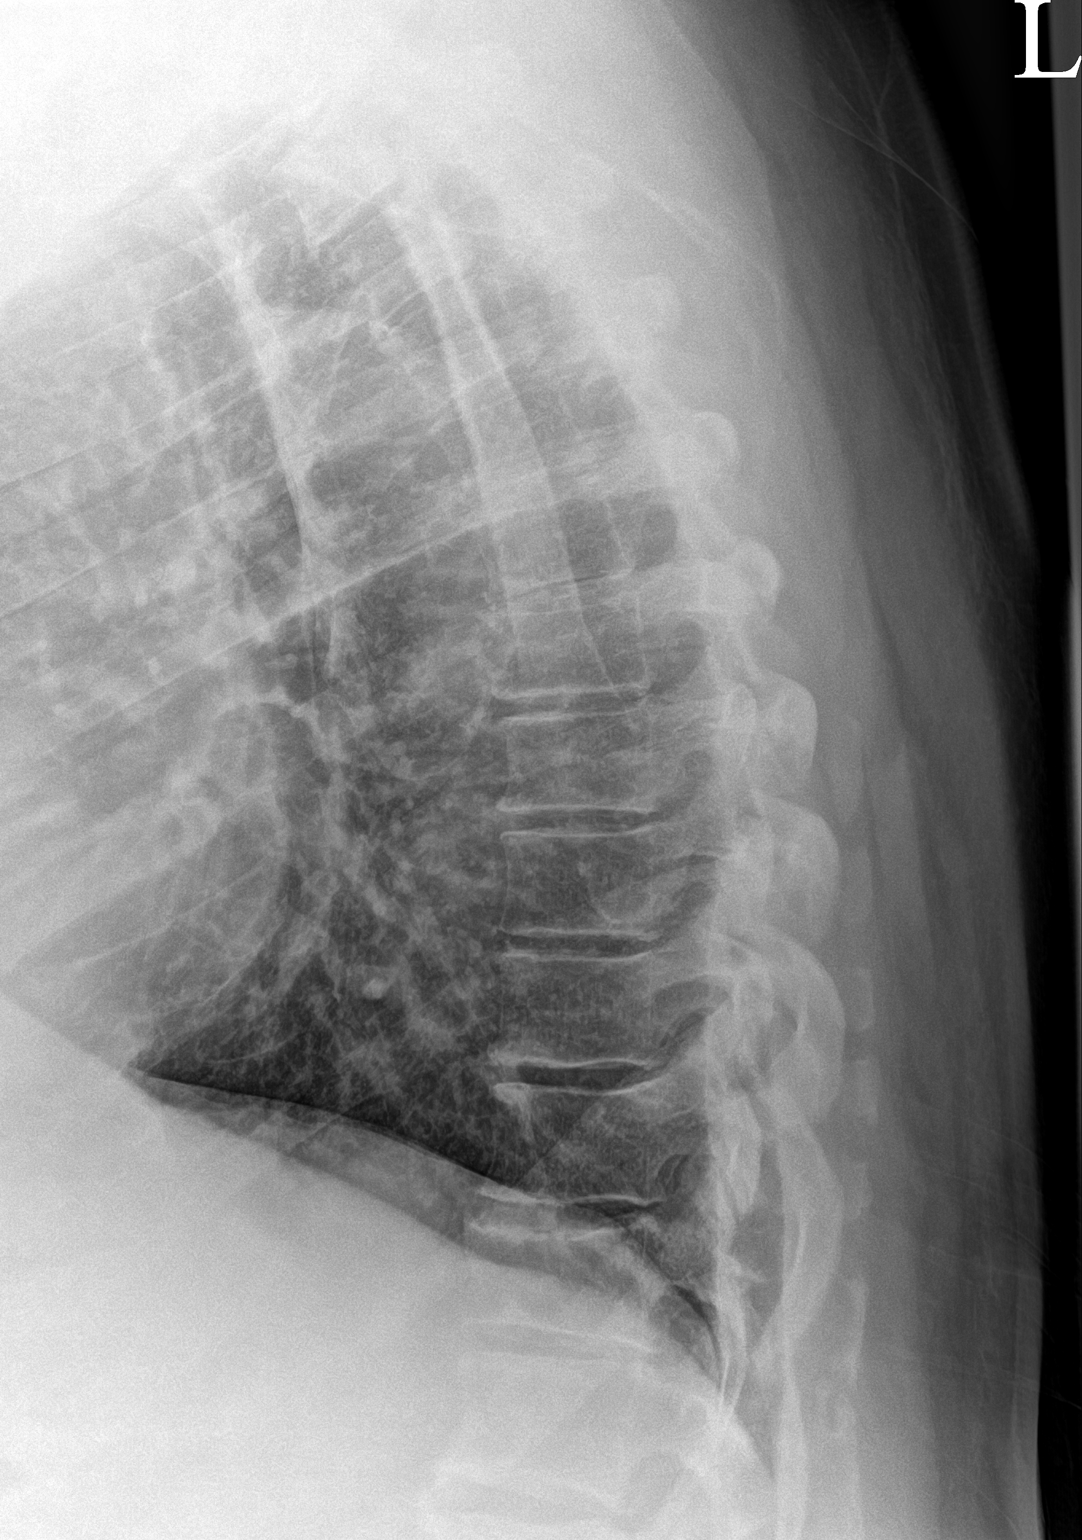

[ct-spine swimmers]
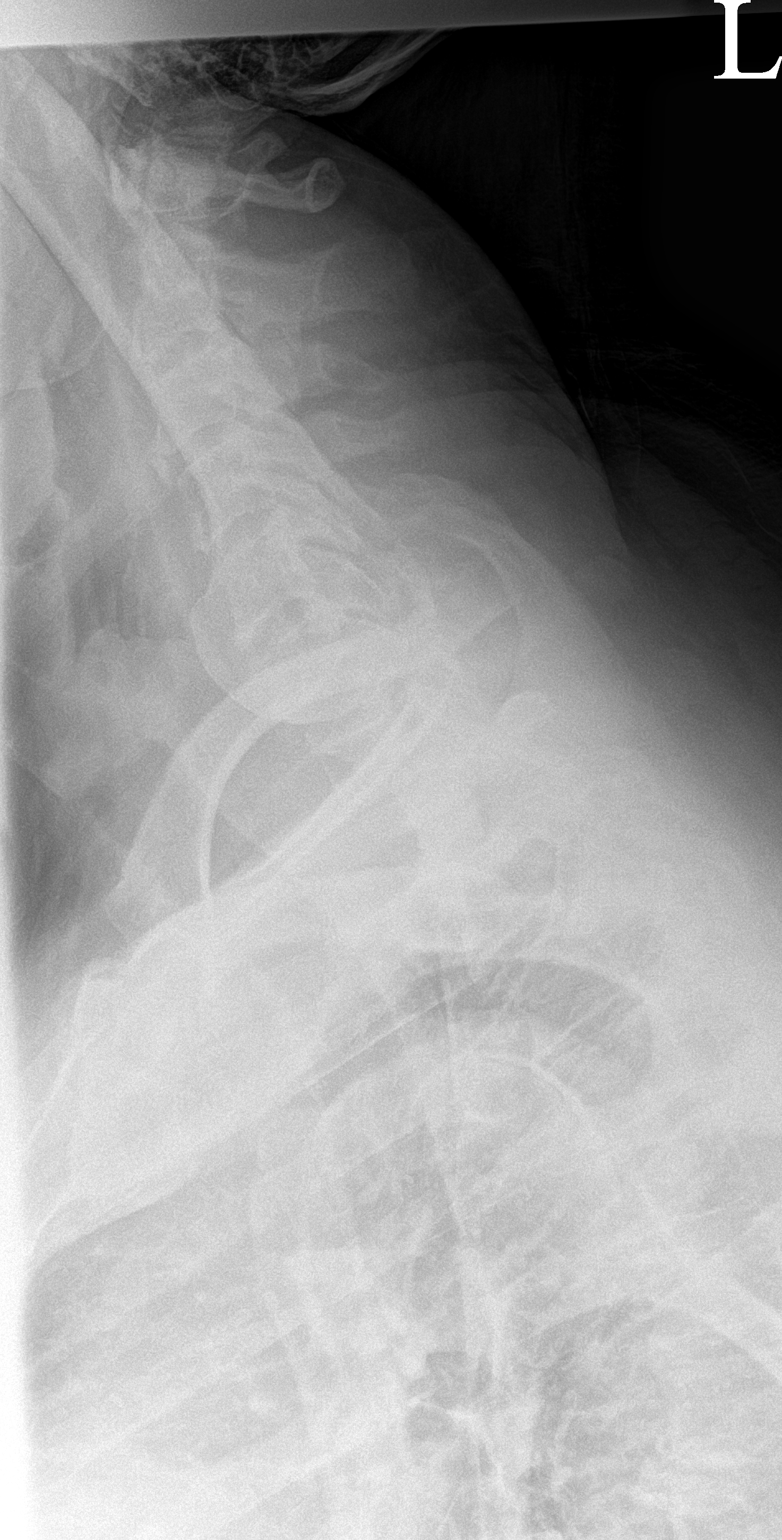

[3 of 3 positions shown; findings below may reference images not displayed]

FINDINGS: There is no evidence of thoracic spine fracture. Alignment is
normal. No other significant bone abnormalities are identified.
IMPRESSION: Negative.

## 2021-03-24 ENCOUNTER — Other Ambulatory Visit: Payer: Self-pay | Admitting: Internal Medicine

## 2021-03-24 DIAGNOSIS — I10 Essential (primary) hypertension: Secondary | ICD-10-CM

## 2021-03-24 MED ORDER — OLMESARTAN MEDOXOMIL-HCTZ 40-12.5 MG PO TABS
1.0000 | ORAL_TABLET | Freq: Every day | ORAL | 0 refills | Status: DC
Start: 2021-03-24 — End: 2021-12-24

## 2021-06-21 ENCOUNTER — Other Ambulatory Visit: Payer: Self-pay | Admitting: Internal Medicine

## 2021-06-21 DIAGNOSIS — I10 Essential (primary) hypertension: Secondary | ICD-10-CM
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
## 2021-07-17 ENCOUNTER — Ambulatory Visit: Payer: Self-pay | Admitting: Internal Medicine

## 2021-12-24 ENCOUNTER — Ambulatory Visit (INDEPENDENT_AMBULATORY_CARE_PROVIDER_SITE_OTHER): Payer: BC Managed Care – PPO | Admitting: Internal Medicine

## 2021-12-24 ENCOUNTER — Encounter: Payer: Self-pay | Admitting: Internal Medicine

## 2021-12-24 VITALS — BP 154/108 | HR 67 | Temp 98.1°F | Resp 16 | Ht 68.0 in | Wt 271.0 lb

## 2021-12-24 DIAGNOSIS — R0609 Other forms of dyspnea: Secondary | ICD-10-CM

## 2021-12-24 DIAGNOSIS — Z0001 Encounter for general adult medical examination with abnormal findings: Secondary | ICD-10-CM

## 2021-12-24 DIAGNOSIS — I10 Essential (primary) hypertension: Secondary | ICD-10-CM

## 2021-12-24 LAB — CBC WITH DIFFERENTIAL/PLATELET
Basophils Absolute: 0 10*3/uL (ref 0.0–0.1)
Basophils Relative: 0.3 % (ref 0.0–3.0)
Eosinophils Absolute: 0.2 10*3/uL (ref 0.0–0.7)
Eosinophils Relative: 2.3 % (ref 0.0–5.0)
HCT: 47 % (ref 39.0–52.0)
Hemoglobin: 16.3 g/dL (ref 13.0–17.0)
Lymphocytes Relative: 22.8 % (ref 12.0–46.0)
Lymphs Abs: 2.2 10*3/uL (ref 0.7–4.0)
MCHC: 34.6 g/dL (ref 30.0–36.0)
MCV: 84.2 fl (ref 78.0–100.0)
Monocytes Absolute: 0.8 10*3/uL (ref 0.1–1.0)
Monocytes Relative: 8.4 % (ref 3.0–12.0)
Neutro Abs: 6.5 10*3/uL (ref 1.4–7.7)
Neutrophils Relative %: 66.2 % (ref 43.0–77.0)
Platelets: 250 10*3/uL (ref 150.0–400.0)
RBC: 5.58 Mil/uL (ref 4.22–5.81)
RDW: 13.9 % (ref 11.5–15.5)
WBC: 9.8 10*3/uL (ref 4.0–10.5)

## 2021-12-24 LAB — URINALYSIS, ROUTINE W REFLEX MICROSCOPIC
Bilirubin Urine: NEGATIVE
Hgb urine dipstick: NEGATIVE
Ketones, ur: NEGATIVE
Leukocytes,Ua: NEGATIVE
Nitrite: NEGATIVE
RBC / HPF: NONE SEEN (ref 0–?)
Specific Gravity, Urine: <=1.005 — AB (ref 1.000–1.030)
Total Protein, Urine: NEGATIVE
Urine Glucose: NEGATIVE
Urobilinogen, UA: 0.2 (ref 0.0–1.0)
WBC, UA: NONE SEEN (ref 0–?)
pH: 6 (ref 5.0–8.0)

## 2021-12-24 LAB — LIPID PANEL
Cholesterol: 178 mg/dL (ref 0–200)
HDL: 36.7 mg/dL — ABNORMAL LOW (ref >39.00)
LDL Cholesterol: 106 mg/dL — ABNORMAL HIGH (ref 0–99)
NonHDL: 141.3
Total CHOL/HDL Ratio: 5
Triglycerides: 179 mg/dL — ABNORMAL HIGH (ref 0.0–149.0)
VLDL: 35.8 mg/dL (ref 0.0–40.0)

## 2021-12-24 LAB — HEPATIC FUNCTION PANEL
ALT: 49 U/L (ref 0–53)
AST: 28 U/L (ref 0–37)
Albumin: 4.2 g/dL (ref 3.5–5.2)
Alkaline Phosphatase: 95 U/L (ref 39–117)
Bilirubin, Direct: 0.1 mg/dL (ref 0.0–0.3)
Total Bilirubin: 0.8 mg/dL (ref 0.2–1.2)
Total Protein: 6.6 g/dL (ref 6.0–8.3)

## 2021-12-24 LAB — BASIC METABOLIC PANEL
BUN: 21 mg/dL (ref 6–23)
CO2: 29 mEq/L (ref 19–32)
Calcium: 9.5 mg/dL (ref 8.4–10.5)
Chloride: 102 mEq/L (ref 96–112)
Creatinine, Ser: 1.21 mg/dL (ref 0.40–1.50)
GFR: 66.55 mL/min (ref >60.00)
Glucose, Bld: 85 mg/dL (ref 70–99)
Potassium: 4 mEq/L (ref 3.5–5.1)
Sodium: 143 mEq/L (ref 135–145)

## 2021-12-24 LAB — TSH: TSH: 2.05 u[IU]/mL (ref 0.35–5.50)

## 2021-12-24 LAB — HEMOGLOBIN A1C: Hgb A1c MFr Bld: 5.5 % (ref 4.6–6.5)

## 2021-12-24 LAB — TROPONIN I (HIGH SENSITIVITY): High Sens Troponin I: 6 ng/L (ref 2–17)

## 2021-12-24 LAB — PSA: PSA: 4.39 ng/mL — ABNORMAL HIGH (ref 0.10–4.00)

## 2021-12-24 LAB — BRAIN NATRIURETIC PEPTIDE: Pro B Natriuretic peptide (BNP): 16 pg/mL (ref 0.0–100.0)

## 2021-12-24 MED ORDER — OLMESARTAN MEDOXOMIL 40 MG PO TABS: 40.0000 mg | ORAL_TABLET | Freq: Every day | ORAL | 0 refills | Status: AC

## 2021-12-24 MED ORDER — INDAPAMIDE 1.25 MG PO TABS: 1.2500 mg | ORAL_TABLET | Freq: Every day | ORAL | 0 refills | Status: AC

## 2021-12-24 NOTE — Patient Instructions (Signed)
Health Maintenance, Male Adopting a healthy lifestyle and getting preventive care are important in promoting health and wellness. Ask your health care provider about: The right schedule for you to have regular tests and exams. Things you can do on your own to prevent diseases and keep yourself healthy. What should I know about diet, weight, and exercise? Eat a healthy diet  Eat a diet that includes plenty of vegetables, fruits, low-fat dairy products, and lean protein. Do not eat a lot of foods that are high in solid fats, added sugars, or sodium. Maintain a healthy weight Body mass index (BMI) is a measurement that can be used to identify possible weight problems. It estimates body fat based on height and weight. Your health care provider can help determine your BMI and help you achieve or maintain a healthy weight. Get regular exercise Get regular exercise. This is one of the most important things you can do for your health. Most adults should: Exercise for at least 150 minutes each week. The exercise should increase your heart rate and make you sweat (moderate-intensity exercise). Do strengthening exercises at least twice a week. This is in addition to the moderate-intensity exercise. Spend less time sitting. Even light physical activity can be beneficial. Watch cholesterol and blood lipids Have your blood tested for lipids and cholesterol at 57 years of age, then have this test every 5 years. You may need to have your cholesterol levels checked more often if: Your lipid or cholesterol levels are high. You are older than 57 years of age. You are at high risk for heart disease. What should I know about cancer screening? Many types of cancers can be detected early and may often be prevented. Depending on your health history and family history, you may need to have cancer screening at various ages. This may include screening for: Colorectal cancer. Prostate cancer. Skin cancer. Lung  cancer. What should I know about heart disease, diabetes, and high blood pressure? Blood pressure and heart disease High blood pressure causes heart disease and increases the risk of stroke. This is more likely to develop in people who have high blood pressure readings or are overweight. Talk with your health care provider about your target blood pressure readings. Have your blood pressure checked: Every 3-5 years if you are 18-39 years of age. Every year if you are 40 years old or older. If you are between the ages of 65 and 75 and are a current or former smoker, ask your health care provider if you should have a one-time screening for abdominal aortic aneurysm (AAA). Diabetes Have regular diabetes screenings. This checks your fasting blood sugar level. Have the screening done: Once every three years after age 45 if you are at a normal weight and have a low risk for diabetes. More often and at a younger age if you are overweight or have a high risk for diabetes. What should I know about preventing infection? Hepatitis B If you have a higher risk for hepatitis B, you should be screened for this virus. Talk with your health care provider to find out if you are at risk for hepatitis B infection. Hepatitis C Blood testing is recommended for: Everyone born from 1945 through 1965. Anyone with known risk factors for hepatitis C. Sexually transmitted infections (STIs) You should be screened each year for STIs, including gonorrhea and chlamydia, if: You are sexually active and are younger than 57 years of age. You are older than 57 years of age and your   health care provider tells you that you are at risk for this type of infection. Your sexual activity has changed since you were last screened, and you are at increased risk for chlamydia or gonorrhea. Ask your health care provider if you are at risk. Ask your health care provider about whether you are at high risk for HIV. Your health care provider  may recommend a prescription medicine to help prevent HIV infection. If you choose to take medicine to prevent HIV, you should first get tested for HIV. You should then be tested every 3 months for as long as you are taking the medicine. Follow these instructions at home: Alcohol use Do not drink alcohol if your health care provider tells you not to drink. If you drink alcohol: Limit how much you have to 0-2 drinks a day. Know how much alcohol is in your drink. In the [place] one drink equals one 12 oz bottle of beer (355 mL), one 5 oz glass of wine (148 mL), or one 1 oz glass of hard liquor (44 mL). Lifestyle Do not use any products that contain nicotine or tobacco. These products include cigarettes, chewing tobacco, and vaping devices, such as e-cigarettes. If you need help quitting, ask your health care provider. Do not use street drugs. Do not share needles. Ask your health care provider for help if you need support or information about quitting drugs. General instructions Schedule regular health, dental, and eye exams. Stay current with your vaccines. Tell your health care provider if: You often feel depressed. You have ever been abused or do not feel safe at home. Summary Adopting a healthy lifestyle and getting preventive care are important in promoting health and wellness. Follow your health care provider's instructions about healthy diet, exercising, and getting tested or screened for diseases. Follow your health care provider's instructions on monitoring your cholesterol and blood pressure. This information is not intended to replace advice given to you by your health care provider. Make sure you discuss any questions you have with your health care provider. Document Revised: 10/14/2020 Document Reviewed: 10/14/2020 Elsevier Patient Education  2023 [employer]  
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
## 2021-12-29 ENCOUNTER — Encounter: Payer: Self-pay | Admitting: Internal Medicine

## 2022-01-06 LAB — ALDOSTERONE + RENIN ACTIVITY W/ RATIO
ALDO / PRA Ratio: 2.5 Ratio (ref 0.9–28.9)
Aldosterone: 6 ng/dL
Renin Activity: 2.4 ng/mL/h (ref 0.25–5.82)
